# Patient Record
Sex: Female | Born: 1965 | Race: Black or African American | Hispanic: No | Marital: Married | Smoking: Never smoker
Health system: Southern US, Community
[De-identification: ages and names within clinical notes are randomized; demographics above are authoritative.]

## PROBLEM LIST (undated history)

## (undated) DIAGNOSIS — I10 Essential (primary) hypertension: Secondary | ICD-10-CM

---

## 2010-09-28 ENCOUNTER — Emergency Department (HOSPITAL_COMMUNITY)
Admission: EM | Admit: 2010-09-28 | Discharge: 2010-09-29 | Payer: Self-pay | Source: Home / Self Care | Admitting: Emergency Medicine

## 2010-09-29 ENCOUNTER — Encounter (INDEPENDENT_AMBULATORY_CARE_PROVIDER_SITE_OTHER): Payer: Self-pay | Admitting: General Surgery

## 2010-09-29 ENCOUNTER — Ambulatory Visit (HOSPITAL_COMMUNITY)
Admission: EM | Admit: 2010-09-29 | Discharge: 2010-09-30 | Payer: Self-pay | Source: Home / Self Care | Admitting: General Surgery

## 2011-01-31 ENCOUNTER — Other Ambulatory Visit (HOSPITAL_COMMUNITY)
Admission: RE | Admit: 2011-01-31 | Discharge: 2011-01-31 | Disposition: A | Discharge status: Home / Self Care | Payer: 59 | Source: Ambulatory Visit | Attending: Obstetrics and Gynecology | Admitting: Obstetrics and Gynecology

## 2011-01-31 DIAGNOSIS — Z01419 Encounter for gynecological examination (general) (routine) without abnormal findings: Secondary | ICD-10-CM | POA: Insufficient documentation

## 2011-02-01 ENCOUNTER — Other Ambulatory Visit: Payer: Self-pay | Admitting: Obstetrics and Gynecology

## 2011-02-15 ENCOUNTER — Other Ambulatory Visit: Payer: Self-pay | Admitting: Obstetrics and Gynecology

## 2011-02-15 DIAGNOSIS — Z1231 Encounter for screening mammogram for malignant neoplasm of breast: Secondary | ICD-10-CM

## 2011-02-23 LAB — URINALYSIS, ROUTINE W REFLEX MICROSCOPIC
Bilirubin Urine: NEGATIVE
Ketones, ur: 15 mg/dL — AB
Nitrite: NEGATIVE
Protein, ur: 30 mg/dL — AB
Urobilinogen, UA: 0.2 mg/dL (ref 0.0–1.0)
pH: 5.5 (ref 5.0–8.0)

## 2011-02-23 LAB — DIFFERENTIAL
Basophils Absolute: 0 10*3/uL (ref 0.0–0.1)
Basophils Absolute: 0.1 10*3/uL (ref 0.0–0.1)
Basophils Relative: 0 % (ref 0–1)
Basophils Relative: 0 % (ref 0–1)
Eosinophils Absolute: 0.1 10*3/uL (ref 0.0–0.7)
Eosinophils Relative: 1 % (ref 0–5)
Eosinophils Relative: 1 % (ref 0–5)
Monocytes Absolute: 1 10*3/uL (ref 0.1–1.0)
Monocytes Absolute: 1.3 10*3/uL — ABNORMAL HIGH (ref 0.1–1.0)

## 2011-02-23 LAB — COMPREHENSIVE METABOLIC PANEL
AST: 56 U/L — ABNORMAL HIGH (ref 0–37)
Albumin: 2.5 g/dL — ABNORMAL LOW (ref 3.5–5.2)
Alkaline Phosphatase: 66 U/L (ref 39–117)
Chloride: 103 mEq/L (ref 96–112)
Creatinine, Ser: 1.22 mg/dL — ABNORMAL HIGH (ref 0.4–1.2)
GFR calc Af Amer: 58 mL/min — ABNORMAL LOW (ref >60)
Potassium: 3.6 mEq/L (ref 3.5–5.1)
Total Bilirubin: 1 mg/dL (ref 0.3–1.2)

## 2011-02-23 LAB — HEPATIC FUNCTION PANEL
Bilirubin, Direct: 0.3 mg/dL (ref 0.0–0.3)
Indirect Bilirubin: 1.2 mg/dL — ABNORMAL HIGH (ref 0.3–0.9)

## 2011-02-23 LAB — POCT I-STAT, CHEM 8
Chloride: 99 mEq/L (ref 96–112)
Creatinine, Ser: 1.5 mg/dL — ABNORMAL HIGH (ref 0.4–1.2)
Hemoglobin: 13.9 g/dL (ref 12.0–15.0)
Potassium: 3.4 mEq/L — ABNORMAL LOW (ref 3.5–5.1)
Sodium: 132 mEq/L — ABNORMAL LOW (ref 135–145)

## 2011-02-23 LAB — CBC
HCT: 35.6 % — ABNORMAL LOW (ref 36.0–46.0)
HCT: 37.9 % (ref 36.0–46.0)
MCH: 28.4 pg (ref 26.0–34.0)
MCHC: 33.7 g/dL (ref 30.0–36.0)
MCHC: 33.8 g/dL (ref 30.0–36.0)
MCV: 84.2 fL (ref 78.0–100.0)
MCV: 86.2 fL (ref 78.0–100.0)
Platelets: 257 10*3/uL (ref 150–400)
Platelets: 363 10*3/uL (ref 150–400)
RBC: 3.37 MIL/uL — ABNORMAL LOW (ref 3.87–5.11)
RDW: 13.4 % (ref 11.5–15.5)
RDW: 13.5 % (ref 11.5–15.5)
WBC: 7.3 10*3/uL (ref 4.0–10.5)

## 2011-02-23 LAB — LIPASE, BLOOD: Lipase: 26 U/L (ref 11–59)

## 2011-02-23 LAB — WET PREP, GENITAL
Trich, Wet Prep: NONE SEEN
Yeast Wet Prep HPF POC: NONE SEEN

## 2011-02-23 LAB — GC/CHLAMYDIA PROBE AMP, GENITAL: Chlamydia, DNA Probe: NEGATIVE

## 2011-02-23 LAB — URINE MICROSCOPIC-ADD ON

## 2011-06-06 ENCOUNTER — Ambulatory Visit: Payer: Self-pay

## 2011-08-09 ENCOUNTER — Other Ambulatory Visit: Payer: Self-pay | Admitting: Obstetrics and Gynecology

## 2011-08-09 DIAGNOSIS — Z1231 Encounter for screening mammogram for malignant neoplasm of breast: Secondary | ICD-10-CM

## 2011-08-29 ENCOUNTER — Ambulatory Visit
Admission: RE | Admit: 2011-08-29 | Discharge: 2011-08-29 | Disposition: A | Discharge status: Home / Self Care | Payer: 59 | Source: Ambulatory Visit | Attending: Obstetrics and Gynecology | Admitting: Obstetrics and Gynecology

## 2011-08-29 DIAGNOSIS — Z1231 Encounter for screening mammogram for malignant neoplasm of breast: Secondary | ICD-10-CM

## 2012-04-19 ENCOUNTER — Other Ambulatory Visit: Payer: Self-pay | Admitting: Obstetrics and Gynecology

## 2012-04-19 DIAGNOSIS — Z1231 Encounter for screening mammogram for malignant neoplasm of breast: Secondary | ICD-10-CM

## 2012-04-19 DIAGNOSIS — Z803 Family history of malignant neoplasm of breast: Secondary | ICD-10-CM

## 2012-08-27 ENCOUNTER — Other Ambulatory Visit: Payer: Self-pay | Admitting: Obstetrics and Gynecology

## 2012-08-27 ENCOUNTER — Other Ambulatory Visit (HOSPITAL_COMMUNITY)
Admission: RE | Admit: 2012-08-27 | Discharge: 2012-08-27 | Disposition: A | Discharge status: Home / Self Care | Payer: 59 | Source: Ambulatory Visit | Attending: Obstetrics and Gynecology | Admitting: Obstetrics and Gynecology

## 2012-08-27 DIAGNOSIS — N760 Acute vaginitis: Secondary | ICD-10-CM | POA: Insufficient documentation

## 2012-08-27 DIAGNOSIS — Z01419 Encounter for gynecological examination (general) (routine) without abnormal findings: Secondary | ICD-10-CM | POA: Insufficient documentation

## 2012-08-29 ENCOUNTER — Ambulatory Visit
Admission: RE | Admit: 2012-08-29 | Discharge: 2012-08-29 | Disposition: A | Discharge status: Home / Self Care | Payer: 59 | Source: Ambulatory Visit | Attending: Obstetrics and Gynecology | Admitting: Obstetrics and Gynecology

## 2012-08-29 DIAGNOSIS — Z803 Family history of malignant neoplasm of breast: Secondary | ICD-10-CM

## 2012-08-29 DIAGNOSIS — Z1231 Encounter for screening mammogram for malignant neoplasm of breast: Secondary | ICD-10-CM

## 2012-08-31 ENCOUNTER — Other Ambulatory Visit: Payer: Self-pay | Admitting: Obstetrics and Gynecology

## 2012-08-31 DIAGNOSIS — R928 Other abnormal and inconclusive findings on diagnostic imaging of breast: Secondary | ICD-10-CM

## 2012-09-07 ENCOUNTER — Ambulatory Visit
Admission: RE | Admit: 2012-09-07 | Discharge: 2012-09-07 | Disposition: A | Discharge status: Home / Self Care | Payer: 59 | Source: Ambulatory Visit | Attending: Obstetrics and Gynecology | Admitting: Obstetrics and Gynecology

## 2012-09-07 DIAGNOSIS — R928 Other abnormal and inconclusive findings on diagnostic imaging of breast: Secondary | ICD-10-CM

## 2013-09-23 ENCOUNTER — Other Ambulatory Visit: Payer: Self-pay

## 2013-09-23 DIAGNOSIS — Z1231 Encounter for screening mammogram for malignant neoplasm of breast: Secondary | ICD-10-CM

## 2013-10-16 ENCOUNTER — Ambulatory Visit
Admission: RE | Admit: 2013-10-16 | Discharge: 2013-10-16 | Disposition: A | Discharge status: Home / Self Care | Payer: 59 | Source: Ambulatory Visit

## 2013-10-16 DIAGNOSIS — Z1231 Encounter for screening mammogram for malignant neoplasm of breast: Secondary | ICD-10-CM

## 2014-09-10 ENCOUNTER — Other Ambulatory Visit: Payer: Self-pay

## 2014-09-10 DIAGNOSIS — Z1231 Encounter for screening mammogram for malignant neoplasm of breast: Secondary | ICD-10-CM

## 2014-10-09 ENCOUNTER — Other Ambulatory Visit: Payer: Self-pay | Admitting: Obstetrics and Gynecology

## 2014-10-09 ENCOUNTER — Other Ambulatory Visit (HOSPITAL_COMMUNITY)
Admission: RE | Admit: 2014-10-09 | Discharge: 2014-10-09 | Disposition: A | Discharge status: Home / Self Care | Payer: 59 | Source: Ambulatory Visit | Attending: Obstetrics and Gynecology | Admitting: Obstetrics and Gynecology

## 2014-10-09 DIAGNOSIS — Z01419 Encounter for gynecological examination (general) (routine) without abnormal findings: Secondary | ICD-10-CM | POA: Insufficient documentation

## 2014-10-09 DIAGNOSIS — Z1151 Encounter for screening for human papillomavirus (HPV): Secondary | ICD-10-CM | POA: Diagnosis present

## 2014-10-10 LAB — CYTOLOGY - PAP

## 2014-10-17 ENCOUNTER — Ambulatory Visit: Payer: 59

## 2014-10-31 ENCOUNTER — Emergency Department (INDEPENDENT_AMBULATORY_CARE_PROVIDER_SITE_OTHER)
Admission: EM | Admit: 2014-10-31 | Discharge: 2014-10-31 | Disposition: A | Discharge status: Home / Self Care | Payer: 59 | Source: Home / Self Care | Attending: Family Medicine | Admitting: Family Medicine

## 2014-10-31 ENCOUNTER — Encounter (HOSPITAL_COMMUNITY): Payer: Self-pay | Admitting: Emergency Medicine

## 2014-10-31 DIAGNOSIS — S134XXA Sprain of ligaments of cervical spine, initial encounter: Secondary | ICD-10-CM

## 2014-10-31 HISTORY — DX: Essential (primary) hypertension: I10

## 2014-10-31 MED ORDER — CYCLOBENZAPRINE HCL 5 MG PO TABS: 5.0000 mg | ORAL_TABLET | Freq: Every evening | ORAL | Status: DC | PRN

## 2014-10-31 MED ORDER — TRAMADOL HCL 50 MG PO TABS: 50.0000 mg | ORAL_TABLET | Freq: Four times a day (QID) | ORAL | Status: DC | PRN

## 2014-10-31 NOTE — Discharge Instructions (Signed)
Thank you for coming in today. °Come back or go to the emergency room if you notice new weakness new numbness problems walking or bowel or bladder problems. ° ° °Cervical Strain and Sprain (Whiplash) °with Rehab °Cervical strain and sprain are injuries that commonly occur with "whiplash" injuries. Whiplash occurs when the neck is forcefully whipped backward or forward, such as during a motor vehicle accident or during contact sports. The muscles, ligaments, tendons, discs, and nerves of the neck are susceptible to injury when this occurs. °RISK FACTORS °Risk of having a whiplash injury increases if: °· Osteoarthritis of the spine. °· Situations that make head or neck accidents or trauma more likely. °· High-risk sports (football, rugby, wrestling, hockey, auto racing, gymnastics, diving, contact karate, or boxing). °· Poor strength and flexibility of the neck. °· Previous neck injury. °· Poor tackling technique. °· Improperly fitted or padded equipment. °SYMPTOMS  °· Pain or stiffness in the front or back of neck or both. °· Symptoms may present immediately or up to 24 hours after injury. °· Dizziness, headache, nausea, and vomiting. °· Muscle spasm with soreness and stiffness in the neck. °· Tenderness and swelling at the injury site. °PREVENTION °· Learn and use proper technique (avoid tackling with the head, spearing, and head-butting; use proper falling techniques to avoid landing on the head). °· Warm up and stretch properly before activity. °· Maintain physical fitness: °¨ Strength, flexibility, and endurance. °¨ Cardiovascular fitness. °· Wear properly fitted and padded protective equipment, such as padded soft collars, for participation in contact sports. °PROGNOSIS  °Recovery from cervical strain and sprain injuries is dependent on the extent of the injury. These injuries are usually curable in 1 week to 3 months with appropriate treatment.  °RELATED COMPLICATIONS  °· Temporary numbness and weakness may  occur if the nerve roots are damaged, and this may persist until the nerve has completely healed. °· Chronic pain due to frequent recurrence of symptoms. °· Prolonged healing, especially if activity is resumed too soon (before complete recovery). °TREATMENT  °Treatment initially involves the use of ice and medication to help reduce pain and inflammation. It is also important to perform strengthening and stretching exercises and modify activities that worsen symptoms so the injury does not get worse. These exercises may be performed at home or with a therapist. For patients who experience severe symptoms, a soft, padded collar may be recommended to be worn around the neck.  °Improving your posture may help reduce symptoms. Posture improvement includes pulling your chin and abdomen in while sitting or standing. If you are sitting, sit in a firm chair with your buttocks against the back of the chair. While sleeping, try replacing your pillow with a small towel rolled to 2 inches in diameter, or use a cervical pillow or soft cervical collar. Poor sleeping positions delay healing.  °For patients with nerve root damage, which causes numbness or weakness, the use of a cervical traction apparatus may be recommended. Surgery is rarely necessary for these injuries. However, cervical strain and sprains that are present at birth (congenital) may require surgery. °MEDICATION  °· If pain medication is necessary, nonsteroidal anti-inflammatory medications, such as aspirin and ibuprofen, or other minor pain relievers, such as acetaminophen, are often recommended. °· Do not take pain medication for 7 days before surgery. °· Prescription pain relievers may be given if deemed necessary by your caregiver. Use only as directed and only as much as you need. °HEAT AND COLD:  °· Cold treatment (icing) relieves   pain and reduces inflammation. Cold treatment should be applied for 10 to 15 minutes every 2 to 3 hours for inflammation and pain  and immediately after any activity that aggravates your symptoms. Use ice packs or an ice massage. °· Heat treatment may be used prior to performing the stretching and strengthening activities prescribed by your caregiver, physical therapist, or athletic trainer. Use a heat pack or a warm soak. °SEEK MEDICAL CARE IF:  °· Symptoms get worse or do not improve in 2 weeks despite treatment. °· New, unexplained symptoms develop (drugs used in treatment may produce side effects). °EXERCISES °RANGE OF MOTION (ROM) AND STRETCHING EXERCISES - Cervical Strain and Sprain °These exercises may help you when beginning to rehabilitate your injury. In order to successfully resolve your symptoms, you must improve your posture. These exercises are designed to help reduce the forward-head and rounded-shoulder posture which contributes to this condition. Your symptoms may resolve with or without further involvement from your physician, physical therapist or athletic trainer. While completing these exercises, remember:  °· Restoring tissue flexibility helps normal motion to return to the joints. This allows healthier, less painful movement and activity. °· An effective stretch should be held for at least 20 seconds, although you may need to begin with shorter hold times for comfort. °· A stretch should never be painful. You should only feel a gentle lengthening or release in the stretched tissue. °STRETCH- Axial Extensors °· Lie on your back on the floor. You may bend your knees for comfort. Place a rolled-up hand towel or dish towel, about 2 inches in diameter, under the part of your head that makes contact with the floor. °· Gently tuck your chin, as if trying to make a "double chin," until you feel a gentle stretch at the base of your head. °· Hold __________ seconds. °Repeat __________ times. Complete this exercise __________ times per day.  °STRETCH - Axial Extension  °· Stand or sit on a firm surface. Assume a good posture: chest  up, shoulders drawn back, abdominal muscles slightly tense, knees unlocked (if standing) and feet hip width apart. °· Slowly retract your chin so your head slides back and your chin slightly lowers. Continue to look straight ahead. °· You should feel a gentle stretch in the back of your head. Be certain not to feel an aggressive stretch since this can cause headaches later. °· Hold for __________ seconds. °Repeat __________ times. Complete this exercise __________ times per day. °STRETCH - Cervical Side Bend  °· Stand or sit on a firm surface. Assume a good posture: chest up, shoulders drawn back, abdominal muscles slightly tense, knees unlocked (if standing) and feet hip width apart. °· Without letting your nose or shoulders move, slowly tip your right / left ear to your shoulder until your feel a gentle stretch in the muscles on the opposite side of your neck. °· Hold __________ seconds. °Repeat __________ times. Complete this exercise __________ times per day. °STRETCH - Cervical Rotators  °· Stand or sit on a firm surface. Assume a good posture: chest up, shoulders drawn back, abdominal muscles slightly tense, knees unlocked (if standing) and feet hip width apart. °· Keeping your eyes level with the ground, slowly turn your head until you feel a gentle stretch along the back and opposite side of your neck. °· Hold __________ seconds. °Repeat __________ times. Complete this exercise __________ times per day. °RANGE OF MOTION - Neck Circles  °· Stand or sit on a firm surface. Assume a   good posture: chest up, shoulders drawn back, abdominal muscles slightly tense, knees unlocked (if standing) and feet hip width apart. °· Gently roll your head down and around from the back of one shoulder to the back of the other. The motion should never be forced or painful. °· Repeat the motion 10-20 times, or until you feel the neck muscles relax and loosen. °Repeat __________ times. Complete the exercise __________ times per  day. °STRENGTHENING EXERCISES - Cervical Strain and Sprain °These exercises may help you when beginning to rehabilitate your injury. They may resolve your symptoms with or without further involvement from your physician, physical therapist, or athletic trainer. While completing these exercises, remember:  °· Muscles can gain both the endurance and the strength needed for everyday activities through controlled exercises. °· Complete these exercises as instructed by your physician, physical therapist, or athletic trainer. Progress the resistance and repetitions only as guided. °· You may experience muscle soreness or fatigue, but the pain or discomfort you are trying to eliminate should never worsen during these exercises. If this pain does worsen, stop and make certain you are following the directions exactly. If the pain is still present after adjustments, discontinue the exercise until you can discuss the trouble with your clinician. °STRENGTH - Cervical Flexors, Isometric °· Face a wall, standing about 6 inches away. Place a small pillow, a ball about 6-8 inches in diameter, or a folded towel between your forehead and the wall. °· Slightly tuck your chin and gently push your forehead into the soft object. Push only with mild to moderate intensity, building up tension gradually. Keep your jaw and forehead relaxed. °· Hold 10 to 20 seconds. Keep your breathing relaxed. °· Release the tension slowly. Relax your neck muscles completely before you start the next repetition. °Repeat __________ times. Complete this exercise __________ times per day. °STRENGTH- Cervical Lateral Flexors, Isometric  °· Stand about 6 inches away from a wall. Place a small pillow, a ball about 6-8 inches in diameter, or a folded towel between the side of your head and the wall. °· Slightly tuck your chin and gently tilt your head into the soft object. Push only with mild to moderate intensity, building up tension gradually. Keep your jaw and  forehead relaxed. °· Hold 10 to 20 seconds. Keep your breathing relaxed. °· Release the tension slowly. Relax your neck muscles completely before you start the next repetition. °Repeat __________ times. Complete this exercise __________ times per day. °STRENGTH - Cervical Extensors, Isometric  °· Stand about 6 inches away from a wall. Place a small pillow, a ball about 6-8 inches in diameter, or a folded towel between the back of your head and the wall. °· Slightly tuck your chin and gently tilt your head back into the soft object. Push only with mild to moderate intensity, building up tension gradually. Keep your jaw and forehead relaxed. °· Hold 10 to 20 seconds. Keep your breathing relaxed. °· Release the tension slowly. Relax your neck muscles completely before you start the next repetition. °Repeat __________ times. Complete this exercise __________ times per day. °POSTURE AND BODY MECHANICS CONSIDERATIONS - Cervical Strain and Sprain °Keeping correct posture when sitting, standing or completing your activities will reduce the stress put on different body tissues, allowing injured tissues a chance to heal and limiting painful experiences. The following are general guidelines for improved posture. Your physician or physical therapist will provide you with any instructions specific to your needs. While reading these guidelines, remember: °·   The exercises prescribed by your provider will help you have the flexibility and strength to maintain correct postures. °· The correct posture provides the optimal environment for your joints to work. All of your joints have less wear and tear when properly supported by a spine with good posture. This means you will experience a healthier, less painful body. °· Correct posture must be practiced with all of your activities, especially prolonged sitting and standing. Correct posture is as important when doing repetitive low-stress activities (typing) as it is when doing a single  heavy-load activity (lifting). °PROLONGED STANDING WHILE SLIGHTLY LEANING FORWARD °When completing a task that requires you to lean forward while standing in one place for a long time, place either foot up on a stationary 2- to 4-inch high object to help maintain the best posture. When both feet are on the ground, the low back tends to lose its slight inward curve. If this curve flattens (or becomes too large), then the back and your other joints will experience too much stress, fatigue more quickly, and can cause pain.  °RESTING POSITIONS °Consider which positions are most painful for you when choosing a resting position. If you have pain with flexion-based activities (sitting, bending, stooping, squatting), choose a position that allows you to rest in a less flexed posture. You would want to avoid curling into a fetal position on your side. If your pain worsens with extension-based activities (prolonged standing, working overhead), avoid resting in an extended position such as sleeping on your stomach. Most people will find more comfort when they rest with their spine in a more neutral position, neither too rounded nor too arched. Lying on a non-sagging bed on your side with a pillow between your knees, or on your back with a pillow under your knees will often provide some relief. Keep in mind, being in any one position for a prolonged period of time, no matter how correct your posture, can still lead to stiffness. °WALKING °Walk with an upright posture. Your ears, shoulders, and hips should all line up. °OFFICE WORK °When working at a desk, create an environment that supports good, upright posture. Without extra support, muscles fatigue and lead to excessive strain on joints and other tissues. °CHAIR: °· A chair should be able to slide under your desk when your back makes contact with the back of the chair. This allows you to work closely. °· The chair's height should allow your eyes to be level with the upper  part of your monitor and your hands to be slightly lower than your elbows. °· Body position: °¨ Your feet should make contact with the floor. If this is not possible, use a foot rest. °¨ Keep your ears over your shoulders. This will reduce stress on your neck and low back. °Document Released: 11/28/2005 Document Revised: 04/14/2014 Document Reviewed: 03/12/2009 °ExitCare® Patient Information ©2015 [employer]. This information is not intended to replace advice given to you by your health care provider. Make sure you discuss any questions you have with your health care provider. ° °
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
## 2014-11-11 ENCOUNTER — Ambulatory Visit
Admission: RE | Admit: 2014-11-11 | Discharge: 2014-11-11 | Disposition: A | Discharge status: Home / Self Care | Payer: 59 | Source: Ambulatory Visit

## 2014-11-11 DIAGNOSIS — Z1231 Encounter for screening mammogram for malignant neoplasm of breast: Secondary | ICD-10-CM

## 2014-11-12 ENCOUNTER — Other Ambulatory Visit: Payer: Self-pay | Admitting: Internal Medicine

## 2014-11-12 DIAGNOSIS — R928 Other abnormal and inconclusive findings on diagnostic imaging of breast: Secondary | ICD-10-CM

## 2014-11-20 ENCOUNTER — Ambulatory Visit: Payer: 59 | Attending: Family Medicine | Admitting: Physical Therapy

## 2014-11-20 ENCOUNTER — Telehealth: Payer: Self-pay | Admitting: *Deleted

## 2014-11-20 DIAGNOSIS — S161XXD Strain of muscle, fascia and tendon at neck level, subsequent encounter: Secondary | ICD-10-CM | POA: Diagnosis present

## 2014-11-20 DIAGNOSIS — S161XXA Strain of muscle, fascia and tendon at neck level, initial encounter: Secondary | ICD-10-CM

## 2014-11-20 NOTE — Patient Instructions (Signed)
AROM: Neck Rotation   Turn head slowly to look over one shoulder, then the other. Hold each position ___2_ seconds. Repeat ___5_ times per set. Do _1___ sets per session. Do ___5_ sessions per day.  http://orth.exer.us/294   Copyright  VHI. All rights reserved.  AROM: Lateral Neck Flexion   Slowly tilt head toward one shoulder, then the other. Hold each position ____2 seconds. Repeat ___5_ times per set. Do ___1_ sets per session. Do __5__ sessions per day.  http://orth.exer.us/296   Copyright  VHI. All rights reserved.  Extension   Hands behind neck, bend head back as far as is comfortable. Hold __2__ seconds. Repeat _5___ times. Do __5__ sessions per day.  Extensors, Supine   Lie supine, head on small, rolled towel. Gently tuck chin and bring toward chest. Hold _2__ seconds. Repeat _5__ times per session. Do __5_ sessions per day.  Copyright  VHI. All rights reserved.  Flexibility: Neck Retraction   Pull head straight back, keeping eyes and jaw level. Repeat ___2_ times per set. Do __2 sets per session. Do 5____ sessions per day.  http://orth.exer.us/344   Copyright  VHI. All rights reserved.

## 2014-11-20 NOTE — Telephone Encounter (Signed)
appts made printed. Pt only wanted to schedule the 2 days that's she's scheduled for...td
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
## 2014-11-27 ENCOUNTER — Ambulatory Visit: Payer: 59 | Admitting: Physical Therapy

## 2014-12-01 ENCOUNTER — Inpatient Hospital Stay
Admission: RE | Admit: 2014-12-01 | Discharge: 2014-12-01 | Disposition: A | Discharge status: Home / Self Care | Payer: 59 | Source: Ambulatory Visit | Attending: Internal Medicine | Admitting: Internal Medicine

## 2014-12-02 ENCOUNTER — Encounter: Payer: 59 | Admitting: Rehabilitation

## 2015-01-26 ENCOUNTER — Other Ambulatory Visit: Payer: 59

## 2015-02-03 ENCOUNTER — Ambulatory Visit
Admission: RE | Admit: 2015-02-03 | Discharge: 2015-02-03 | Disposition: A | Discharge status: Home / Self Care | Payer: 59 | Source: Ambulatory Visit | Attending: Internal Medicine | Admitting: Internal Medicine

## 2015-02-03 ENCOUNTER — Other Ambulatory Visit: Payer: Self-pay | Admitting: Internal Medicine

## 2015-02-03 DIAGNOSIS — R928 Other abnormal and inconclusive findings on diagnostic imaging of breast: Secondary | ICD-10-CM

## 2015-10-29 ENCOUNTER — Other Ambulatory Visit: Payer: Self-pay | Admitting: Obstetrics and Gynecology

## 2015-10-29 ENCOUNTER — Other Ambulatory Visit (HOSPITAL_COMMUNITY)
Admission: RE | Admit: 2015-10-29 | Discharge: 2015-10-29 | Disposition: A | Discharge status: Home / Self Care | Payer: 59 | Source: Ambulatory Visit | Attending: Obstetrics and Gynecology | Admitting: Obstetrics and Gynecology

## 2015-10-29 DIAGNOSIS — Z01419 Encounter for gynecological examination (general) (routine) without abnormal findings: Secondary | ICD-10-CM | POA: Insufficient documentation

## 2015-11-03 LAB — CYTOLOGY - PAP

## 2015-12-16 ENCOUNTER — Other Ambulatory Visit: Payer: Self-pay

## 2015-12-16 DIAGNOSIS — Z1231 Encounter for screening mammogram for malignant neoplasm of breast: Secondary | ICD-10-CM

## 2016-01-01 ENCOUNTER — Ambulatory Visit
Admission: RE | Admit: 2016-01-01 | Discharge: 2016-01-01 | Disposition: A | Discharge status: Home / Self Care | Payer: 59 | Source: Ambulatory Visit

## 2016-01-01 DIAGNOSIS — Z1231 Encounter for screening mammogram for malignant neoplasm of breast: Secondary | ICD-10-CM

## 2016-12-07 ENCOUNTER — Other Ambulatory Visit: Payer: Self-pay | Admitting: Internal Medicine

## 2016-12-07 DIAGNOSIS — Z1231 Encounter for screening mammogram for malignant neoplasm of breast: Secondary | ICD-10-CM

## 2017-01-02 ENCOUNTER — Other Ambulatory Visit: Payer: Self-pay | Admitting: Obstetrics and Gynecology

## 2017-01-02 ENCOUNTER — Other Ambulatory Visit (HOSPITAL_COMMUNITY)
Admission: RE | Admit: 2017-01-02 | Discharge: 2017-01-02 | Disposition: A | Discharge status: Home / Self Care | Payer: 59 | Source: Ambulatory Visit | Attending: Obstetrics and Gynecology | Admitting: Obstetrics and Gynecology

## 2017-01-02 DIAGNOSIS — Z01419 Encounter for gynecological examination (general) (routine) without abnormal findings: Secondary | ICD-10-CM | POA: Insufficient documentation

## 2017-01-03 LAB — CYTOLOGY - PAP: DIAGNOSIS: NEGATIVE

## 2017-01-09 ENCOUNTER — Ambulatory Visit
Admission: RE | Admit: 2017-01-09 | Discharge: 2017-01-09 | Disposition: A | Discharge status: Home / Self Care | Payer: 59 | Source: Ambulatory Visit | Attending: Internal Medicine | Admitting: Internal Medicine

## 2017-01-09 DIAGNOSIS — Z1231 Encounter for screening mammogram for malignant neoplasm of breast: Secondary | ICD-10-CM

## 2017-12-06 ENCOUNTER — Other Ambulatory Visit: Payer: Self-pay | Admitting: Internal Medicine

## 2017-12-06 DIAGNOSIS — Z1231 Encounter for screening mammogram for malignant neoplasm of breast: Secondary | ICD-10-CM

## 2018-01-10 ENCOUNTER — Ambulatory Visit
Admission: RE | Admit: 2018-01-10 | Discharge: 2018-01-10 | Disposition: A | Discharge status: Home / Self Care | Payer: Managed Care, Other (non HMO) | Source: Ambulatory Visit | Attending: Internal Medicine | Admitting: Internal Medicine

## 2018-01-10 DIAGNOSIS — Z1231 Encounter for screening mammogram for malignant neoplasm of breast: Secondary | ICD-10-CM

## 2018-12-13 ENCOUNTER — Other Ambulatory Visit: Payer: Self-pay | Admitting: Internal Medicine

## 2018-12-13 DIAGNOSIS — Z1231 Encounter for screening mammogram for malignant neoplasm of breast: Secondary | ICD-10-CM

## 2018-12-28 LAB — HM COLONOSCOPY

## 2019-01-14 ENCOUNTER — Ambulatory Visit: Payer: Managed Care, Other (non HMO)

## 2019-01-31 ENCOUNTER — Ambulatory Visit: Payer: Self-pay | Admitting: Internal Medicine

## 2019-02-08 ENCOUNTER — Ambulatory Visit
Admission: RE | Admit: 2019-02-08 | Discharge: 2019-02-08 | Disposition: A | Discharge status: Home / Self Care | Payer: Managed Care, Other (non HMO) | Source: Ambulatory Visit | Attending: Internal Medicine | Admitting: Internal Medicine

## 2019-02-08 DIAGNOSIS — Z1231 Encounter for screening mammogram for malignant neoplasm of breast: Secondary | ICD-10-CM

## 2019-02-28 ENCOUNTER — Other Ambulatory Visit: Payer: Self-pay

## 2019-02-28 ENCOUNTER — Encounter: Payer: Self-pay | Admitting: Internal Medicine

## 2019-02-28 ENCOUNTER — Encounter: Payer: Managed Care, Other (non HMO) | Admitting: Internal Medicine

## 2019-02-28 MED ORDER — OLMESARTAN MEDOXOMIL-HCTZ 40-25 MG PO TABS: 1.0000 | ORAL_TABLET | Freq: Every day | ORAL | 0 refills | Status: DC

## 2019-03-10 NOTE — Progress Notes (Signed)
She left prior to being seen. She will be rescheduled for an e-visit.

## 2019-03-15 ENCOUNTER — Other Ambulatory Visit: Payer: Self-pay

## 2019-03-15 MED ORDER — OLMESARTAN MEDOXOMIL-HCTZ 40-25 MG PO TABS: 1.0000 | ORAL_TABLET | Freq: Every day | ORAL | 0 refills | Status: DC

## 2019-03-23 ENCOUNTER — Encounter: Payer: Self-pay | Admitting: Internal Medicine

## 2019-07-01 ENCOUNTER — Other Ambulatory Visit: Payer: Self-pay | Admitting: Obstetrics and Gynecology

## 2019-07-01 ENCOUNTER — Other Ambulatory Visit (HOSPITAL_COMMUNITY)
Admission: RE | Admit: 2019-07-01 | Discharge: 2019-07-01 | Disposition: A | Discharge status: Home / Self Care | Payer: Managed Care, Other (non HMO) | Source: Ambulatory Visit | Attending: Obstetrics and Gynecology | Admitting: Obstetrics and Gynecology

## 2019-07-01 DIAGNOSIS — Z01419 Encounter for gynecological examination (general) (routine) without abnormal findings: Secondary | ICD-10-CM | POA: Diagnosis present

## 2019-07-03 LAB — CYTOLOGY - PAP
Diagnosis: NEGATIVE
HPV: NOT DETECTED

## 2019-08-12 ENCOUNTER — Other Ambulatory Visit: Payer: Self-pay

## 2019-08-12 ENCOUNTER — Encounter: Payer: Self-pay | Admitting: Internal Medicine

## 2019-08-12 ENCOUNTER — Ambulatory Visit (INDEPENDENT_AMBULATORY_CARE_PROVIDER_SITE_OTHER): Payer: Managed Care, Other (non HMO) | Admitting: Internal Medicine

## 2019-08-12 VITALS — BP 138/80 | HR 73 | Temp 98.1°F | Ht 63.6 in | Wt 224.8 lb

## 2019-08-12 DIAGNOSIS — Z6839 Body mass index (BMI) 39.0-39.9, adult: Secondary | ICD-10-CM

## 2019-08-12 DIAGNOSIS — M25512 Pain in left shoulder: Secondary | ICD-10-CM | POA: Diagnosis not present

## 2019-08-12 DIAGNOSIS — Z0000 Encounter for general adult medical examination without abnormal findings: Secondary | ICD-10-CM | POA: Diagnosis not present

## 2019-08-12 DIAGNOSIS — I10 Essential (primary) hypertension: Secondary | ICD-10-CM | POA: Diagnosis not present

## 2019-08-12 DIAGNOSIS — E66812 Obesity, class 2: Secondary | ICD-10-CM

## 2019-08-12 LAB — POCT URINALYSIS DIPSTICK
Bilirubin, UA: NEGATIVE
Blood, UA: NEGATIVE
Glucose, UA: NEGATIVE
Ketones, UA: NEGATIVE
Leukocytes, UA: NEGATIVE
Nitrite, UA: NEGATIVE
Protein, UA: NEGATIVE
Spec Grav, UA: <=1.005 — AB (ref 1.010–1.025)
Urobilinogen, UA: 0.2 E.U./dL
pH, UA: 6 (ref 5.0–8.0)

## 2019-08-12 LAB — POCT UA - MICROALBUMIN
Albumin/Creatinine Ratio, Urine, POC: <30
Creatinine, POC: 10 mg/dL
Microalbumin Ur, POC: 10 mg/L

## 2019-08-12 LAB — CBC
Hematocrit: 33.6 % — ABNORMAL LOW (ref 34.0–46.6)
Hemoglobin: 10.8 g/dL — ABNORMAL LOW (ref 11.1–15.9)
MCH: 27.3 pg (ref 26.6–33.0)
MCHC: 32.1 g/dL (ref 31.5–35.7)
MCV: 85 fL (ref 79–97)
Platelets: 353 10*3/uL (ref 150–450)
RBC: 3.95 x10E6/uL (ref 3.77–5.28)
RDW: 16.3 % — ABNORMAL HIGH (ref 11.7–15.4)
WBC: 4.8 10*3/uL (ref 3.4–10.8)

## 2019-08-12 LAB — LIPID PANEL
Chol/HDL Ratio: 2.7 ratio (ref 0.0–4.4)
Cholesterol, Total: 197 mg/dL (ref 100–199)
HDL: 73 mg/dL (ref >39)
LDL Chol Calc (NIH): 109 mg/dL — ABNORMAL HIGH (ref 0–99)
Triglycerides: 81 mg/dL (ref 0–149)
VLDL Cholesterol Cal: 15 mg/dL (ref 5–40)

## 2019-08-12 LAB — CMP14+EGFR
ALT: 9 IU/L (ref 0–32)
AST: 16 IU/L (ref 0–40)
Albumin/Globulin Ratio: 1.2 (ref 1.2–2.2)
Albumin: 4.1 g/dL (ref 3.8–4.9)
Alkaline Phosphatase: 60 IU/L (ref 39–117)
BUN/Creatinine Ratio: 9 (ref 9–23)
BUN: 9 mg/dL (ref 6–24)
Bilirubin Total: 0.5 mg/dL (ref 0.0–1.2)
CO2: 21 mmol/L (ref 20–29)
Calcium: 9.3 mg/dL (ref 8.7–10.2)
Chloride: 103 mmol/L (ref 96–106)
Creatinine, Ser: 1.04 mg/dL — ABNORMAL HIGH (ref 0.57–1.00)
GFR calc Af Amer: 72 mL/min/{1.73_m2} (ref >59)
GFR calc non Af Amer: 62 mL/min/{1.73_m2} (ref >59)
Globulin, Total: 3.3 g/dL (ref 1.5–4.5)
Glucose: 95 mg/dL (ref 65–99)
Potassium: 3.9 mmol/L (ref 3.5–5.2)
Sodium: 139 mmol/L (ref 134–144)
Total Protein: 7.4 g/dL (ref 6.0–8.5)

## 2019-08-12 LAB — HEMOGLOBIN A1C
Est. average glucose Bld gHb Est-mCnc: 120 mg/dL
Hgb A1c MFr Bld: 5.8 % — ABNORMAL HIGH (ref 4.8–5.6)

## 2019-08-12 MED ORDER — OLMESARTAN MEDOXOMIL-HCTZ 40-25 MG PO TABS: 1.0000 | ORAL_TABLET | Freq: Every day | ORAL | 2 refills | Status: DC

## 2019-08-12 MED ORDER — OLMESARTAN MEDOXOMIL-HCTZ 40-25 MG PO TABS: 1.0000 | ORAL_TABLET | Freq: Every day | ORAL | 1 refills | Status: DC

## 2019-08-12 NOTE — Addendum Note (Signed)
Addended by: Michelle Nasuti on: 08/12/2019 12:29 PM   Modules accepted: Orders

## 2019-08-12 NOTE — Patient Instructions (Addendum)
Voltaren cream for shoulder pain  Health Maintenance, Female Adopting a healthy lifestyle and getting preventive care are important in promoting health and wellness. Ask your health care provider about:  The right schedule for you to have regular tests and exams.  Things you can do on your own to prevent diseases and keep yourself healthy. What should I know about diet, weight, and exercise? Eat a healthy diet   Eat a diet that includes plenty of vegetables, fruits, low-fat dairy products, and lean protein.  Do not eat a lot of foods that are high in solid fats, added sugars, or sodium. Maintain a healthy weight Body mass index (BMI) is used to identify weight problems. It estimates body fat based on height and weight. Your health care provider can help determine your BMI and help you achieve or maintain a healthy weight. Get regular exercise Get regular exercise. This is one of the most important things you can do for your health. Most adults should:  Exercise for at least 150 minutes each week. The exercise should increase your heart rate and make you sweat (moderate-intensity exercise).  Do strengthening exercises at least twice a week. This is in addition to the moderate-intensity exercise.  Spend less time sitting. Even light physical activity can be beneficial. Watch cholesterol and blood lipids Have your blood tested for lipids and cholesterol at 51 years of age, then have this test every 5 years. Have your cholesterol levels checked more often if:  Your lipid or cholesterol levels are high.  You are older than 51 years of age.  You are at high risk for heart disease. What should I know about cancer screening? Depending on your health history and family history, you may need to have cancer screening at various ages. This may include screening for:  Breast cancer.  Cervical cancer.  Colorectal cancer.  Skin cancer.  Lung cancer. What should I know about heart  disease, diabetes, and high blood pressure? Blood pressure and heart disease  High blood pressure causes heart disease and increases the risk of stroke. This is more likely to develop in people who have high blood pressure readings, are of African descent, or are overweight.  Have your blood pressure checked: ? Every 3-5 years if you are 38-76 years of age. ? Every year if you are 66 years old or older. Diabetes Have regular diabetes screenings. This checks your fasting blood sugar level. Have the screening done:  Once every three years after age 29 if you are at a normal weight and have a low risk for diabetes.  More often and at a younger age if you are overweight or have a high risk for diabetes. What should I know about preventing infection? Hepatitis B If you have a higher risk for hepatitis B, you should be screened for this virus. Talk with your health care provider to find out if you are at risk for hepatitis B infection. Hepatitis C Testing is recommended for:  Everyone born from [birth year] through 1963.  Anyone with known risk factors for hepatitis C. Sexually transmitted infections (STIs)  Get screened for STIs, including gonorrhea and chlamydia, if: ? You are sexually active and are younger than 51 years of age. ? You are older than 51 years of age and your health care provider tells you that you are at risk for this type of infection. ? Your sexual activity has changed since you were last screened, and you are at increased risk for chlamydia or gonorrhea.  Ask your health care provider if you are at risk.  Ask your health care provider about whether you are at high risk for HIV. Your health care provider may recommend a prescription medicine to help prevent HIV infection. If you choose to take medicine to prevent HIV, you should first get tested for HIV. You should then be tested every 3 months for as long as you are taking the medicine. Pregnancy  If you are about to stop  having your period (premenopausal) and you may become pregnant, seek counseling before you get pregnant.  Take 400 to 800 micrograms (mcg) of folic acid every day if you become pregnant.  Ask for birth control (contraception) if you want to prevent pregnancy. Osteoporosis and menopause Osteoporosis is a disease in which the bones lose minerals and strength with aging. This can result in bone fractures. If you are 90 years old or older, or if you are at risk for osteoporosis and fractures, ask your health care provider if you should:  Be screened for bone loss.  Take a calcium or vitamin D supplement to lower your risk of fractures.  Be given hormone replacement therapy (HRT) to treat symptoms of menopause. Follow these instructions at home: Lifestyle  Do not use any products that contain nicotine or tobacco, such as cigarettes, e-cigarettes, and chewing tobacco. If you need help quitting, ask your health care provider.  Do not use street drugs.  Do not share needles.  Ask your health care provider for help if you need support or information about quitting drugs. Alcohol use  Do not drink alcohol if: ? Your health care provider tells you not to drink. ? You are pregnant, may be pregnant, or are planning to become pregnant.  If you drink alcohol: ? Limit how much you use to 0-1 drink a day. ? Limit intake if you are breastfeeding.  Be aware of how much alcohol is in your drink. In the [place] one drink equals one 12 oz bottle of beer (355 mL), one 5 oz glass of wine (148 mL), or one 1 oz glass of hard liquor (44 mL). General instructions  Schedule regular health, dental, and eye exams.  Stay current with your vaccines.  Tell your health care provider if: ? You often feel depressed. ? You have ever been abused or do not feel safe at home. Summary  Adopting a healthy lifestyle and getting preventive care are important in promoting health and wellness.  Follow your health  care provider's instructions about healthy diet, exercising, and getting tested or screened for diseases.  Follow your health care provider's instructions on monitoring your cholesterol and blood pressure. This information is not intended to replace advice given to you by your health care provider. Make sure you discuss any questions you have with your health care provider. Document Released: 06/13/2011 Document Revised: 11/21/2018 Document Reviewed: 11/21/2018 Elsevier Patient Education  2020 [employer].

## 2019-08-12 NOTE — Progress Notes (Signed)
Subjective:     Patient ID: Christina Houston , female    DOB: 19-Aug-1966 , 53 y.o.   MRN: [id number]   Chief Complaint  Patient presents with  . Annual Exam  . Arm Pain    Left arm pain hard to move at times    HPI  She is here today for a full physical examination. She is followed by Dr. Landry Mellow for her GYN exams.  She was last seen July 2020.   Hypertension This is a chronic problem. The current episode started more than 1 year ago. The problem has been gradually improving since onset. The problem is controlled. Pertinent negatives include no blurred vision, chest pain, palpitations or shortness of breath.     Past Medical History:  Diagnosis Date  . Hypertension      Family History  Problem Relation Age of Onset  . Breast cancer Mother   . Breast cancer Cousin   . Liver disease Father      Current Outpatient Medications:  .  olmesartan-hydrochlorothiazide (BENICAR HCT) 40-25 MG tablet, Take 1 tablet by mouth daily., Disp: 90 tablet, Rfl: 0   No Known Allergies    The patient states she uses none for birth control. Last LMP was Patient's last menstrual period was 08/05/2019.. Negative for Dysmenorrhea Negative for: breast discharge, breast lump(s), breast pain and breast self exam. Associated symptoms include abnormal vaginal bleeding. Pertinent negatives include abnormal bleeding (hematology), anxiety, decreased libido, depression, difficulty falling sleep, dyspareunia, history of infertility, nocturia, sexual dysfunction, sleep disturbances, urinary incontinence, urinary urgency, vaginal discharge and vaginal itching. Diet regular.The patient states her exercise level is  regular.   . The patient's tobacco use is:  Social History   Tobacco Use  Smoking Status Never Smoker  Smokeless Tobacco Never Used  . She has been exposed to passive smoke. The patient's alcohol use is:  Social History   Substance and Sexual Activity  Alcohol Use No  . Additional information:  Last pap was performed in July 2020, next one scheduled for 2021.   Review of Systems  Constitutional: Negative.   HENT: Negative.   Eyes: Negative.  Negative for blurred vision.  Respiratory: Negative.  Negative for shortness of breath.   Cardiovascular: Negative.  Negative for chest pain and palpitations.  Endocrine: Negative.   Genitourinary: Negative.   Musculoskeletal: Positive for arthralgias.       She c/o left arm pain. Started 2 months ago. The discomfort is in her shoulder. She denies fall/trauma. Sometimes she has difficulty with mobility. Sx appear to worsen at night.   Skin: Negative.   Allergic/Immunologic: Negative.   Neurological: Negative.   Hematological: Negative.   Psychiatric/Behavioral: Negative.      Today's Vitals   08/12/19 1001  BP: 138/80  Pulse: 73  Temp: 98.1 F (36.7 C)  TempSrc: Oral  SpO2: 98%  Weight: 224 lb 12.8 oz (102 kg)  Height: 5' 3.6" (1.615 m)  PainSc: 2   PainLoc: Arm   Body mass index is 39.07 kg/m.   Objective:  Physical Exam Vitals signs and nursing note reviewed.  Constitutional:      Appearance: Normal appearance. She is obese.  HENT:     Head: Normocephalic and atraumatic.     Right Ear: Tympanic membrane, ear canal and external ear normal.     Left Ear: Tympanic membrane, ear canal and external ear normal.     Nose: Nose normal.     Mouth/Throat:     Mouth:  Mucous membranes are moist.     Pharynx: Oropharynx is clear.  Eyes:     Extraocular Movements: Extraocular movements intact.     Conjunctiva/sclera: Conjunctivae normal.     Pupils: Pupils are equal, round, and reactive to light.  Neck:     Musculoskeletal: Normal range of motion and neck supple.  Cardiovascular:     Rate and Rhythm: Normal rate and regular rhythm.     Pulses: Normal pulses.     Heart sounds: Normal heart sounds.  Pulmonary:     Effort: Pulmonary effort is normal.     Breath sounds: Normal breath sounds.  Abdominal:     General: Abdomen  is flat. Bowel sounds are normal.     Palpations: Abdomen is soft.  Genitourinary:    Comments: deferred Musculoskeletal: Normal range of motion.     Left shoulder: She exhibits tenderness and pain.     Comments: She has pain with active ROM  Skin:    General: Skin is warm and dry.  Neurological:     General: No focal deficit present.     Mental Status: She is alert and oriented to person, place, and time.  Psychiatric:        Mood and Affect: Mood normal.        Behavior: Behavior normal.         Assessment And Plan:     1. Routine general medical examination at health care facility  A full exam was performed.  Importance of monthly self breast exams was discussed with the patient. PATIENT HAS BEEN ADVISED TO GET 30-45 MINUTES REGULAR EXERCISE NO LESS THAN FOUR TO FIVE DAYS PER WEEK - BOTH WEIGHTBEARING EXERCISES AND AEROBIC ARE RECOMMENDED.  SHE WAS ADVISED TO FOLLOW A HEALTHY DIET WITH AT LEAST SIX FRUITS/VEGGIES PER DAY, DECREASE INTAKE OF RED MEAT, AND TO INCREASE FISH INTAKE TO TWO DAYS PER WEEK.  MEATS/FISH SHOULD NOT BE FRIED, BAKED OR BROILED IS PREFERABLE.  I SUGGEST WEARING SPF 50 SUNSCREEN ON EXPOSED PARTS AND ESPECIALLY WHEN IN THE DIRECT SUNLIGHT FOR AN EXTENDED PERIOD OF TIME.  PLEASE AVOID FAST FOOD RESTAURANTS AND INCREASE YOUR WATER INTAKE.  - CMP14+EGFR - CBC - Lipid panel - Hemoglobin A1c  2. Essential hypertension, benign  Chronic, fair control. She will continue with current meds for now. She is aware that ideal bp is less than 130/80.  EKG performed, no new changes. She will rto in six months for re-evaluation.   - EKG 12-Lead  3. Acute pain of left shoulder  She will try OTC Voltaren gel to affected area 2-3 times per day. Her sx are suggestive of rotator cuff disease. She would like Ortho evaluation. Pt advised she may have radiographic studies during this visit.   - Ambulatory referral to Sports Medicine  4. Class 2 severe obesity due to excess  calories with serious comorbidity and body mass index (BMI) of 39.0 to 39.9 in adult [facility])  Importance of achieving optimal weight to decrease risk of cardiovascular disease and cancers was discussed with the patient in full detail. Importance of regular exercise was discussed with the patient. She is encouraged to strive for BMI less than 32 to decrease cardiac risk.  She was given tips to incorporate more activity into her daily routine - take stairs when possible, park farther away from her job, grocery stores, etc.       Maximino Greenland, MD    THE PATIENT IS ENCOURAGED TO PRACTICE SOCIAL DISTANCING DUE  TO THE COVID-19 PANDEMIC.

## 2019-08-20 ENCOUNTER — Telehealth: Payer: Self-pay

## 2019-08-20 ENCOUNTER — Encounter: Payer: Self-pay | Admitting: Internal Medicine

## 2019-08-20 NOTE — Telephone Encounter (Signed)
I have returned pt call regarding her labs. Left her a v/m to call the office. YRL,RMA

## 2019-08-21 ENCOUNTER — Other Ambulatory Visit: Payer: Self-pay

## 2019-08-21 DIAGNOSIS — I10 Essential (primary) hypertension: Secondary | ICD-10-CM

## 2019-08-21 MED ORDER — OLMESARTAN MEDOXOMIL-HCTZ 40-25 MG PO TABS: 1.0000 | ORAL_TABLET | Freq: Every day | ORAL | 1 refills | Status: DC

## 2020-01-07 ENCOUNTER — Other Ambulatory Visit: Payer: Self-pay | Admitting: Internal Medicine

## 2020-01-07 DIAGNOSIS — Z1231 Encounter for screening mammogram for malignant neoplasm of breast: Secondary | ICD-10-CM

## 2020-01-15 ENCOUNTER — Encounter: Payer: Self-pay | Admitting: Internal Medicine

## 2020-01-16 ENCOUNTER — Encounter: Payer: Self-pay | Admitting: Internal Medicine

## 2020-02-11 ENCOUNTER — Ambulatory Visit: Payer: Managed Care, Other (non HMO)

## 2020-02-11 ENCOUNTER — Ambulatory Visit
Admission: RE | Admit: 2020-02-11 | Discharge: 2020-02-11 | Disposition: A | Discharge status: Home / Self Care | Payer: Managed Care, Other (non HMO) | Source: Ambulatory Visit | Attending: Internal Medicine | Admitting: Internal Medicine

## 2020-02-11 ENCOUNTER — Other Ambulatory Visit: Payer: Self-pay

## 2020-02-11 DIAGNOSIS — Z1231 Encounter for screening mammogram for malignant neoplasm of breast: Secondary | ICD-10-CM

## 2020-02-12 ENCOUNTER — Ambulatory Visit: Payer: Managed Care, Other (non HMO) | Admitting: Internal Medicine

## 2020-02-20 ENCOUNTER — Ambulatory Visit: Payer: Managed Care, Other (non HMO) | Admitting: Internal Medicine

## 2020-02-20 ENCOUNTER — Encounter: Payer: Self-pay | Admitting: Internal Medicine

## 2020-02-20 ENCOUNTER — Other Ambulatory Visit: Payer: Self-pay

## 2020-02-20 VITALS — BP 146/88 | HR 62 | Temp 97.1°F | Ht 63.6 in | Wt 212.6 lb

## 2020-02-20 DIAGNOSIS — I10 Essential (primary) hypertension: Secondary | ICD-10-CM

## 2020-02-20 DIAGNOSIS — Z6836 Body mass index (BMI) 36.0-36.9, adult: Secondary | ICD-10-CM

## 2020-02-20 DIAGNOSIS — R7309 Other abnormal glucose: Secondary | ICD-10-CM

## 2020-02-20 DIAGNOSIS — M25531 Pain in right wrist: Secondary | ICD-10-CM | POA: Diagnosis not present

## 2020-02-20 NOTE — Patient Instructions (Signed)
Calm, magnesium supplement  One teaspoon nightly

## 2020-02-20 NOTE — Progress Notes (Signed)
This visit occurred during the SARS-CoV-2 public health emergency.  Safety protocols were in place, including screening questions prior to the visit, additional usage of staff PPE, and extensive cleaning of exam room while observing appropriate contact time as indicated for disinfecting solutions.  Subjective:     Patient ID: Christina Houston , female    DOB: 15-Aug-1966 , 54 y.o.   MRN: [id number]   Chief Complaint  Patient presents with  . Hypertension    HPI  She presents today for BP check. She reports compliance with meds. Reports that she has made some lifestyle changes -she is moving more and she has made some improvements to her diet.   Hypertension This is a chronic problem. The current episode started more than 1 year ago. The problem has been gradually improving since onset. The problem is uncontrolled. Pertinent negatives include no blurred vision, chest pain, palpitations or shortness of breath. The current treatment provides moderate improvement.     Past Medical History:  Diagnosis Date  . Hypertension      Family History  Problem Relation Age of Onset  . Breast cancer Mother   . Breast cancer Cousin   . Liver disease Father      Current Outpatient Medications:  .  olmesartan-hydrochlorothiazide (BENICAR HCT) 40-25 MG tablet, Take 1 tablet by mouth daily., Disp: 90 tablet, Rfl: 1   No Known Allergies   Review of Systems  Constitutional: Negative.   Eyes: Negative for blurred vision.  Respiratory: Negative.  Negative for shortness of breath.   Cardiovascular: Negative.  Negative for chest pain and palpitations.  Gastrointestinal: Negative.   Musculoskeletal: Positive for arthralgias.       She c/o r wrist pain. She denies fall/trauma. Described as a burning sensation. Denies weakness. Works on Teaching laboratory technician a lot. Not sure what triggers her sx.   Neurological: Negative.   Psychiatric/Behavioral: Negative.      Today's Vitals   02/20/20 1559  BP:  (!) 146/88  Pulse: 62  Temp: (!) 97.1 F (36.2 C)  TempSrc: Oral  Weight: 212 lb 9.6 oz (96.4 kg)  Height: 5' 3.6" (1.615 m)   Body mass index is 36.95 kg/m.   Wt Readings from Last 3 Encounters:  02/20/20 212 lb 9.6 oz (96.4 kg)  08/12/19 224 lb 12.8 oz (102 kg)  02/28/19 226 lb 9.6 oz (102.8 kg)     Objective:  Physical Exam Vitals and nursing note reviewed.  Constitutional:      Appearance: Normal appearance.  HENT:     Head: Normocephalic and atraumatic.  Cardiovascular:     Rate and Rhythm: Normal rate and regular rhythm.     Heart sounds: Normal heart sounds.  Pulmonary:     Effort: Pulmonary effort is normal.     Breath sounds: Normal breath sounds.  Musculoskeletal:     Comments: Neg Tinel's sign, neg Phalen's sign  Skin:    General: Skin is warm.  Neurological:     General: No focal deficit present.     Mental Status: She is alert.  Psychiatric:        Mood and Affect: Mood normal.        Behavior: Behavior normal.         Assessment And Plan:     1. Essential hypertension, benign  Chronic, fair control. I did suggest adding another medication to her regimen; however, she wants to continue to work on lifestyle changes. I will check renal function today.   -  BMP8+EGFR  2. Other abnormal glucose  HER A1C HAS BEEN ELEVATED IN THE PAST. I WILL CHECK AN A1C, BMET TODAY. SHE WAS ENCOURAGED TO AVOID SUGARY BEVERAGES AND PROCESSED FOODS INCLUDNG BREADS, RICE AND PASTA.  - Hemoglobin A1c  3. Right wrist pain  She was given rx wrist splint. She will let me know if her sx persist/worsen. If so, I will schedule her for nerve conduction study.   4. Class 2 severe obesity due to excess calories with serious comorbidity and body mass index (BMI) of 36.0 to 36.9 in adult [facility])  She was congratulated on her 12 pound weight loss over the past six months. She is encouraged to keep up the great work. She is encouraged to strive for BMI less tahn 30 to decrease  caridac risk.    Maximino Greenland, MD    THE PATIENT IS ENCOURAGED TO PRACTICE SOCIAL DISTANCING DUE TO THE COVID-19 PANDEMIC.

## 2020-02-21 LAB — BMP8+EGFR
BUN/Creatinine Ratio: 7 — ABNORMAL LOW (ref 9–23)
BUN: 7 mg/dL (ref 6–24)
CO2: 22 mmol/L (ref 20–29)
Calcium: 9.5 mg/dL (ref 8.7–10.2)
Chloride: 103 mmol/L (ref 96–106)
Creatinine, Ser: 0.94 mg/dL (ref 0.57–1.00)
GFR calc Af Amer: 81 mL/min/{1.73_m2} (ref >59)
GFR calc non Af Amer: 70 mL/min/{1.73_m2} (ref >59)
Glucose: 85 mg/dL (ref 65–99)
Potassium: 4.4 mmol/L (ref 3.5–5.2)
Sodium: 139 mmol/L (ref 134–144)

## 2020-02-21 LAB — HEMOGLOBIN A1C
Est. average glucose Bld gHb Est-mCnc: 117 mg/dL
Hgb A1c MFr Bld: 5.7 % — ABNORMAL HIGH (ref 4.8–5.6)

## 2020-07-13 ENCOUNTER — Encounter: Payer: Self-pay | Admitting: Internal Medicine

## 2020-07-22 ENCOUNTER — Encounter: Payer: Self-pay | Admitting: Internal Medicine

## 2020-08-12 ENCOUNTER — Encounter: Payer: Self-pay | Admitting: Internal Medicine

## 2020-08-24 ENCOUNTER — Encounter: Payer: Managed Care, Other (non HMO) | Admitting: Internal Medicine

## 2020-09-11 ENCOUNTER — Other Ambulatory Visit: Payer: Self-pay | Admitting: Internal Medicine

## 2020-09-11 DIAGNOSIS — I10 Essential (primary) hypertension: Secondary | ICD-10-CM

## 2020-10-27 ENCOUNTER — Other Ambulatory Visit: Payer: Self-pay

## 2020-10-27 ENCOUNTER — Ambulatory Visit (HOSPITAL_COMMUNITY)
Admission: EM | Admit: 2020-10-27 | Discharge: 2020-10-27 | Disposition: A | Discharge status: Home / Self Care | Payer: Self-pay

## 2021-02-23 ENCOUNTER — Telehealth: Payer: Self-pay

## 2021-02-23 NOTE — Telephone Encounter (Addendum)
Patient returned call. Patient to download [facility] Mammography Scholarship [facility] Application, return either by fax, mail, or drop off at [id number].   Patient left message, requesting information regarding any programs offering free or low-cost breast exams/mammograms. Call returned, left message on voicemail requesting return call.

## 2021-03-23 ENCOUNTER — Other Ambulatory Visit: Payer: Self-pay | Admitting: Obstetrics and Gynecology

## 2021-03-23 DIAGNOSIS — Z1231 Encounter for screening mammogram for malignant neoplasm of breast: Secondary | ICD-10-CM

## 2021-04-15 ENCOUNTER — Ambulatory Visit: Payer: No Typology Code available for payment source | Admitting: *Deleted

## 2021-04-15 ENCOUNTER — Other Ambulatory Visit: Payer: Self-pay

## 2021-04-15 ENCOUNTER — Ambulatory Visit
Admission: RE | Admit: 2021-04-15 | Discharge: 2021-04-15 | Disposition: A | Discharge status: Home / Self Care | Payer: Self-pay | Source: Ambulatory Visit | Attending: Obstetrics and Gynecology | Admitting: Obstetrics and Gynecology

## 2021-04-15 VITALS — BP 118/74 | Wt 223.9 lb

## 2021-04-15 DIAGNOSIS — Z1239 Encounter for other screening for malignant neoplasm of breast: Secondary | ICD-10-CM

## 2021-04-15 DIAGNOSIS — Z1231 Encounter for screening mammogram for malignant neoplasm of breast: Secondary | ICD-10-CM

## 2021-04-15 NOTE — Progress Notes (Signed)
Christina Houston is a 55 y.o. female who presents to [facility] clinic today with no complaints.    Pap Smear: Pap smear not completed today. Last Pap smear was 07/01/2019 at [facility] clinic and was normal with negative HPV. Per patient has no history of an abnormal Pap smear. Last Pap smear result is available in Epic.   Physical exam: Breasts Breasts symmetrical. No skin abnormalities bilateral breasts. No nipple retraction bilateral breasts. No nipple discharge bilateral breasts. No lymphadenopathy. No lumps palpated bilateral breasts. No complaints of pain or tenderness on exam.      MM DIGITAL SCREENING BILATERAL  Result Date: 01/09/2017 CLINICAL DATA:  Screening. EXAM: DIGITAL SCREENING BILATERAL MAMMOGRAM WITH CAD COMPARISON:  Previous exam(s). ACR Breast Density Category c: The breast tissue is heterogeneously dense, which may obscure small masses. FINDINGS: There are no findings suspicious for malignancy. Images were processed with CAD. IMPRESSION: No mammographic evidence of malignancy. A result letter of this screening mammogram will be mailed directly to the patient. RECOMMENDATION: Screening mammogram in one year. (Code:SM-B-01Y) BI-RADS CATEGORY  1: Negative. Electronically Signed   By: Dalphine Handing M.D.   On: 01/09/2017 08:11   MM 3D SCREEN BREAST BILATERAL  Result Date: 02/12/2020 CLINICAL DATA:  Screening. EXAM: DIGITAL SCREENING BILATERAL MAMMOGRAM WITH TOMO AND CAD COMPARISON:  Previous exam(s). ACR Breast Density Category c: The breast tissue is heterogeneously dense, which may obscure small masses. FINDINGS: There are no findings suspicious for malignancy. Images were processed with CAD. IMPRESSION: No mammographic evidence of malignancy. A result letter of this screening mammogram will be mailed directly to the patient. RECOMMENDATION: Screening mammogram in one year. (Code:SM-B-01Y) BI-RADS CATEGORY  1: Negative. Electronically Signed   By: Sande Brothers M.D.   On:  02/12/2020 13:54   MM 3D SCREEN BREAST BILATERAL  Result Date: 02/11/2019 CLINICAL DATA:  Screening. EXAM: DIGITAL SCREENING BILATERAL MAMMOGRAM WITH TOMO AND CAD COMPARISON:  Previous exam(s). ACR Breast Density Category c: The breast tissue is heterogeneously dense, which may obscure small masses. FINDINGS: There are no findings suspicious for malignancy. Images were processed with CAD. IMPRESSION: No mammographic evidence of malignancy. A result letter of this screening mammogram will be mailed directly to the patient. RECOMMENDATION: Screening mammogram in one year. (Code:SM-B-01Y) BI-RADS CATEGORY  1: Negative. Electronically Signed   By: Baird Lyons M.D.   On: 02/11/2019 12:24   MM SCREENING BREAST TOMO BILATERAL  Result Date: 01/10/2018 CLINICAL DATA:  Screening. EXAM: 2D DIGITAL SCREENING BILATERAL MAMMOGRAM WITH 3D TOMO WITH CAD COMPARISON:  Previous exam(s). ACR Breast Density Category b: There are scattered areas of fibroglandular density. FINDINGS: There are no findings suspicious for malignancy. Images were processed with CAD. IMPRESSION: No mammographic evidence of malignancy. A result letter of this screening mammogram will be mailed directly to the patient. RECOMMENDATION: Screening mammogram in one year. (Code:SM-B-01Y) BI-RADS CATEGORY  1: Negative. Electronically Signed   By: Amie Portland M.D.   On: 01/10/2018 07:49    Pelvic/Bimanual Pap is not indicated today per [facility]CP guidelines.    Smoking History: Patient has never smoked.   Patient Navigation: Patient education provided. Access to services provided for patient through [facility] program.   Colorectal Cancer Screening: Per patient has had colonoscopy completed on January 2020. No complaints today.    Breast and Cervical Cancer Risk Assessment: Patient has family history of her mother and first maternal cousin having breast cancer. Patient has no known genetic mutations or history of radiation treatment to the chest  before  age 60. Patient does not have history of cervical dysplasia, immunocompromised, or DES exposure in-utero.  Risk Assessment    Risk Scores      04/15/2021   Last edited by: Narda Rutherford, LPN   5-year risk: 2 %   Lifetime risk: 13 %         A: [facility] exam without pap smear No complaints.  P: Referred patient to the [facility] for a screening mammogram on the mobile unit. Appointment scheduled Thursday, Apr 15, 2021 at 1300.  Priscille Heidelberg, RN 04/15/2021 1:03 PM

## 2021-04-15 NOTE — Patient Instructions (Signed)
Explained breast self awareness with Christina Houston. Patient did not need a Pap smear today due to last Pap smear and HPV typing was 07/01/2019. Let her know [facility]CP will cover Pap smears and HPV typing every 5 years unless has a history of abnormal Pap smears. Referred patient to the [facility] for a screening mammogram on the mobile unit. Appointment scheduled Thursday, Apr 15, 2021 at 1300. Patient escorted to the mobile unit following B[facility]CP appointment for her screening mammogram. Let patient know the Breast Center will follow up with her within the next couple weeks with results of her mammogram by letter or phone. Christina Houston verbalized understanding.  Christina Houston, Christina Maser, RN 1:03 PM

## 2021-08-24 ENCOUNTER — Encounter: Payer: Self-pay | Admitting: Nurse Practitioner

## 2021-08-24 ENCOUNTER — Other Ambulatory Visit: Payer: Self-pay

## 2021-08-24 ENCOUNTER — Ambulatory Visit (INDEPENDENT_AMBULATORY_CARE_PROVIDER_SITE_OTHER): Payer: Self-pay | Admitting: Nurse Practitioner

## 2021-08-24 VITALS — BP 140/88 | HR 75 | Temp 98.4°F | Ht 63.0 in | Wt 211.4 lb

## 2021-08-24 DIAGNOSIS — R7309 Other abnormal glucose: Secondary | ICD-10-CM

## 2021-08-24 DIAGNOSIS — Z6836 Body mass index (BMI) 36.0-36.9, adult: Secondary | ICD-10-CM

## 2021-08-24 DIAGNOSIS — Z79899 Other long term (current) drug therapy: Secondary | ICD-10-CM

## 2021-08-24 DIAGNOSIS — E7800 Pure hypercholesterolemia, unspecified: Secondary | ICD-10-CM

## 2021-08-24 DIAGNOSIS — I10 Essential (primary) hypertension: Secondary | ICD-10-CM

## 2021-08-24 DIAGNOSIS — Z111 Encounter for screening for respiratory tuberculosis: Secondary | ICD-10-CM

## 2021-08-24 NOTE — Progress Notes (Signed)
I,Tasnia Spegal,acting as a Education administrator for Minette Brine, FNP.,have documented all relevant documentation on the behalf of Minette Brine, FNP,as directed by  Minette Brine, FNP while in the presence of Minette Brine, [place].  This visit occurred during the SARS-CoV-2 public health emergency.  Safety protocols were in place, including screening questions prior to the visit, additional usage of staff PPE, and extensive cleaning of exam room while observing appropriate contact time as indicated for disinfecting solutions.  Subjective:     Patient ID: Christina Houston , female    DOB: October 31, 1966 , 55 y.o.   MRN: [id number]   Chief Complaint  Patient presents with   PPD Placement    HPI  Pt presents today for TB skin test, she has forms needing to be filled out for work as well. She would not like her flu shot today. (We are out of TB, she would have to get QuantiFERON)  Wt Readings from Last 3 Encounters: 08/24/21 : 211 lb 6.4 oz (95.9 kg) 04/15/21 : 223 lb 14.4 oz (101.6 kg) 02/20/20 : 212 lb 9.6 oz (96.4 kg)    Hypertension This is a chronic problem. The current episode started more than 1 year ago. The problem is uncontrolled. Pertinent negatives include no anxiety, chest pain, headaches or palpitations. Past treatments include ACE inhibitors and diuretics. There are no compliance problems.  There is no history of angina. There is no history of chronic renal disease.    Past Medical History:  Diagnosis Date   Hypertension      Family History  Problem Relation Age of Onset   Breast cancer Mother    Breast cancer Cousin    Liver disease Father      Current Outpatient Medications:    olmesartan-hydrochlorothiazide (BENICAR HCT) 40-25 MG tablet, TAKE 1 TABLET BY MOUTH  DAILY, Disp: 90 tablet, Rfl: 3   No Known Allergies   Review of Systems  Constitutional: Negative.   Respiratory: Negative.    Cardiovascular: Negative.  Negative for chest pain, palpitations and leg swelling.   Neurological: Negative.  Negative for dizziness and headaches.  Psychiatric/Behavioral: Negative.      Today's Vitals   08/24/21 0956  BP: 140/88  Pulse: 75  Temp: 98.4 F (36.9 C)  SpO2: 90%  Weight: 211 lb 6.4 oz (95.9 kg)  Height: 5' 3"  (1.6 m)  PainSc: 0-No pain   Body mass index is 37.45 kg/m.   Objective:  Physical Exam Vitals reviewed.  Constitutional:      General: She is not in acute distress.    Appearance: Normal appearance. She is well-developed.  HENT:     Head: Normocephalic and atraumatic.  Eyes:     Pupils: Pupils are equal, round, and reactive to light.  Cardiovascular:     Rate and Rhythm: Normal rate and regular rhythm.     Pulses: Normal pulses.     Heart sounds: Normal heart sounds. No murmur heard. Pulmonary:     Effort: Pulmonary effort is normal. No respiratory distress.     Breath sounds: Normal breath sounds. No wheezing.  Musculoskeletal:        General: Normal range of motion.  Skin:    General: Skin is warm and dry.     Capillary Refill: Capillary refill takes less than 2 seconds.  Neurological:     General: No focal deficit present.     Mental Status: She is alert and oriented to person, place, and time.     Cranial Nerves: No  cranial nerve deficit.  Psychiatric:        Mood and Affect: Mood normal.        Behavior: Behavior normal.        Thought Content: Thought content normal.        Judgment: Judgment normal.        Assessment And Plan:     1. Essential hypertension, benign Comments: Blood pressure is slightly elevated, she has not taken her medications today - BMP8+eGFR  2. Other abnormal glucose Comments: Stable, no current medications - Hemoglobin A1c  3. Elevated LDL cholesterol level Comments: Slightly elevated at last visit,will check lipids today - Lipid panel  4. Class 2 severe obesity due to excess calories with serious comorbidity and body mass index (BMI) of 36.0 to 36.9 in adult [facility]) Comments: She has  lost 12 lbs since May 2022  5. Visit for TB skin test Comments: TB quantiferon done due to no TB skin test availiable. - QuantiFERON-TB Gold Plus  6. Other long term (current) drug therapy - CBC     Patient was given opportunity to ask questions. Patient verbalized understanding of the plan and was able to repeat key elements of the plan. All questions were answered to their satisfaction.  Minette Brine, FNP   I, Minette Brine, FNP, have reviewed all documentation for this visit. The documentation on 08/24/21 for the exam, diagnosis, procedures, and orders are all accurate and complete.   IF YOU HAVE BEEN REFERRED TO A SPECIALIST, IT MAY TAKE 1-2 WEEKS TO SCHEDULE/PROCESS THE REFERRAL. IF YOU HAVE NOT HEARD FROM US/SPECIALIST IN TWO WEEKS, PLEASE GIVE [place] A CALL AT [phone].   THE PATIENT IS ENCOURAGED TO PRACTICE SOCIAL DISTANCING DUE TO THE COVID-19 PANDEMIC.

## 2021-08-24 NOTE — Patient Instructions (Signed)
Tuberculin Skin Test Why am I having this test? The tuberculin skin test is used to check whether a person has been exposed to the bacteria that causes tuberculosis (TB) (Mycobacterium tuberculosis). Tuberculosis is a bacterial infection that usually affects the lungs but can affect other parts of the body. You may have a tuberculin skin test if: You have possible symptoms of TB, such as: Coughing up blood, mucus from the lungs (sputum), or both. A cough that lasts three weeks or longer. Chest pain, or pain while breathing or coughing. Unexplained weight loss. Fatigue and weakness. Fever, sweating, and chills. Loss of appetite. You are at high risk for getting TB. You may be at high risk if you: Inject illegal drugs or share needles. Have HIV or other diseases that affect the body's disease-fighting system (immune system). Work in a health care facility. Live in a high-risk community, such as a homeless shelter, nursing home, or correctional facility. Have had contact with someone who has TB. Are from or have traveled to a country where TB is common. If you are at high risk, you may need to have regular TB screenings. TB screening may be required when starting a new job, such as becoming a health care worker or a teacher. Colleges or universities may require TB screening fornew students. What is being tested? This test checks for the presence of TB antibodies in the body. Antibodies are part of the body's immune system. After you get an infection, your body makes antibodies that stay in your body after you recover and protect you fromgetting the same infection again. Tell a health care provider about: Any allergies you have. All medicines you are taking, including vitamins, herbs, eye drops, creams, and over-the-counter medicines. Any blood disorders you have. Any surgeries you have had. Any medical conditions you have. Whether you are pregnant or may be pregnant. What happens during the  test?  Your health care provider will inject a solution called PPD (purified protein derivative) under the first layer of skin on your arm. This causes a small, blister-like bump to form over the area temporarily. PPD is made from the bacteria that causes TB. PPD causes your immune system to react, but it does not get you sickwith TB. You may feel mild stinging as this happens. Afterward, the area may itch orburn. How are the results reported? To get your test results, you will need to see your health care provider again within 2-3 days after you received the injection. It is important to follow your health care provider's instructions about when to be seen again. If you are not seen within 2-3 days, you may need to have the test repeated. At your follow-up visit, your health care provider will measure the area where the PPD was injected to see if the bump has gotten larger due to swelling. Your results will be reported as positive or negative. If the bump has disappeared or is small, your test result is negative. Negative means that you do not have the antibodies. If the bump is large, your test result is positive. Positive means that you have the antibodies. Swelling is caused by the antibodies reacting with the PPD. The skin may also turn red around the bump. A false-positive result can occur. A false positive is incorrect because itmeans that a condition is present when it is not. A false-negative result can occur. A false negative is incorrect because it means that a condition is not present when it is. False negatives are   rare and are more likely to occur in older people and in people who have weakened immunesystems. What do the results mean? A negative result means that it is unlikely that you have TB or that you have been exposed to TB bacteria. This test may be repeated, or you may have a blood test to check for TB. This is because your body may not react to the tuberculinskin test until several  weeks after exposure to TB bacteria. A positive result means that you have been exposed to TB, and you may need more tests to determine if you have: Active TB, also called TB disease. This means that you have TB symptoms and your infection can spread to others (you are contagious). Latent TB. This means that you do not have any symptoms of TB and you are not contagious. Latent TB can turn into active TB. Talk with your health care provider about what your results mean. Questions to ask your health care provider Ask your health care provider, or the department that is doing the test: When will my results be ready? How will I get my results? What are my treatment options? What other tests do I need? What are my next steps? Summary The tuberculin skin test is used to check whether a person has been exposed to the bacteria that causes tuberculosis (TB). Your health care provider will inject a solution known as PPD (purified protein derivative) under the first layer of skin on your arm. After 2-3 days, your health care provider will measure the area where the PPD was injected to see if the bump has gotten larger due to swelling. Your results will be reported as positive or negative. A positive result means that you have been exposed to TB. A negative result means that it is unlikely that you have TB or that you have been exposed to TB bacteria. This information is not intended to replace advice given to you by your health care provider. Make sure you discuss any questions you have with your healthcare provider. Document Revised: 08/20/2020 Document Reviewed: 07/30/2020 Elsevier Patient Education  2022 [employer] Inc.  

## 2021-08-27 ENCOUNTER — Encounter: Payer: Self-pay | Admitting: Internal Medicine

## 2021-08-27 LAB — CBC
Hematocrit: 31.3 % — ABNORMAL LOW (ref 34.0–46.6)
Hemoglobin: 10 g/dL — ABNORMAL LOW (ref 11.1–15.9)
MCH: 25.1 pg — ABNORMAL LOW (ref 26.6–33.0)
MCHC: 31.9 g/dL (ref 31.5–35.7)
MCV: 78 fL — ABNORMAL LOW (ref 79–97)
Platelets: 376 10*3/uL (ref 150–450)
RBC: 3.99 x10E6/uL (ref 3.77–5.28)
RDW: 14.6 % (ref 11.7–15.4)
WBC: 4.6 10*3/uL (ref 3.4–10.8)

## 2021-08-27 LAB — BMP8+EGFR
BUN/Creatinine Ratio: 10 (ref 9–23)
BUN: 11 mg/dL (ref 6–24)
CO2: 23 mmol/L (ref 20–29)
Calcium: 10.1 mg/dL (ref 8.7–10.2)
Chloride: 101 mmol/L (ref 96–106)
Creatinine, Ser: 1.15 mg/dL — ABNORMAL HIGH (ref 0.57–1.00)
Glucose: 86 mg/dL (ref 65–99)
Potassium: 4 mmol/L (ref 3.5–5.2)
Sodium: 137 mmol/L (ref 134–144)
eGFR: 57 mL/min/{1.73_m2} — ABNORMAL LOW (ref >59)

## 2021-08-27 LAB — QUANTIFERON-TB GOLD PLUS
QuantiFERON Mitogen Value: 5.79 IU/mL
QuantiFERON Nil Value: 0.01 IU/mL
QuantiFERON TB1 Ag Value: 0.12 IU/mL
QuantiFERON TB2 Ag Value: 0.06 IU/mL
QuantiFERON-TB Gold Plus: NEGATIVE

## 2021-08-27 LAB — LIPID PANEL
Chol/HDL Ratio: 2.4 ratio (ref 0.0–4.4)
Cholesterol, Total: 198 mg/dL (ref 100–199)
HDL: 84 mg/dL (ref >39)
LDL Chol Calc (NIH): 103 mg/dL — ABNORMAL HIGH (ref 0–99)
Triglycerides: 61 mg/dL (ref 0–149)
VLDL Cholesterol Cal: 11 mg/dL (ref 5–40)

## 2021-08-27 LAB — HEMOGLOBIN A1C
Est. average glucose Bld gHb Est-mCnc: 126 mg/dL
Hgb A1c MFr Bld: 6 % — ABNORMAL HIGH (ref 4.8–5.6)

## 2021-08-31 ENCOUNTER — Encounter: Payer: Self-pay | Admitting: Nurse Practitioner

## 2021-08-31 LAB — MEASLES/MUMPS/RUBELLA IMMUNITY
MUMPS ABS, IGG: 226 AU/mL (ref >10.9)
RUBEOLA AB, IGG: 214 AU/mL (ref >16.4)
Rubella Antibodies, IGG: 2.93 index (ref >0.99)

## 2021-08-31 LAB — SPECIMEN STATUS REPORT

## 2021-08-31 LAB — HEPATITIS B SURFACE ANTIBODY,QUALITATIVE: Hep B Surface Ab, Qual: NONREACTIVE

## 2021-10-19 ENCOUNTER — Encounter: Payer: Self-pay | Admitting: Internal Medicine

## 2021-10-19 ENCOUNTER — Telehealth: Payer: No Typology Code available for payment source | Admitting: Family Medicine

## 2021-10-19 DIAGNOSIS — R058 Other specified cough: Secondary | ICD-10-CM

## 2021-10-19 MED ORDER — BENZONATATE 100 MG PO CAPS: 100.0000 mg | ORAL_CAPSULE | Freq: Two times a day (BID) | ORAL | 0 refills | Status: DC | PRN

## 2021-10-19 MED ORDER — PROMETHAZINE-DM 6.25-15 MG/5ML PO SYRP: 2.5000 mL | ORAL_SOLUTION | Freq: Three times a day (TID) | ORAL | 0 refills | Status: DC | PRN

## 2021-10-19 NOTE — Progress Notes (Signed)
Virtual Visit Consent   Christina Houston, you are scheduled for a virtual visit with a [facility] provider today.     Just as with appointments in the office, your consent must be obtained to participate.  Your consent will be active for this visit and any virtual visit you may have with one of our providers in the next 365 days.     If you have a MyChart account, a copy of this consent can be sent to you electronically.  All virtual visits are billed to your insurance company just like a traditional visit in the office.    As this is a virtual visit, video technology does not allow for your provider to perform a traditional examination.  This may limit your provider's ability to fully assess your condition.  If your provider identifies any concerns that need to be evaluated in person or the need to arrange testing (such as labs, EKG, etc.), we will make arrangements to do so.     Although advances in technology are sophisticated, we cannot ensure that it will always work on either your end or our end.  If the connection with a video visit is poor, the visit may have to be switched to a telephone visit.  With either a video or telephone visit, we are not always able to ensure that we have a secure connection.     I need to obtain your verbal consent now.   Are you willing to proceed with your visit today?    Christina Houston has provided verbal consent on 10/19/2021 for a virtual visit (video or telephone).   Freddy Finner, NP   Date: 10/19/2021 9:33 AM   Virtual Visit via Video Note   I, Freddy Finner, connected with  Christina Houston  ([id number], 12-05-1966) on 10/19/21 at  9:30 AM EST by a video-enabled telemedicine application and verified that I am speaking with the correct person using two identifiers.  Location: Patient: Virtual Visit Location Patient: Home Provider: Virtual Visit Location Provider: Home Office   I discussed the limitations of  evaluation and management by telemedicine and the availability of in person appointments. The patient expressed understanding and agreed to proceed.    History of Present Illness: Christina Houston is a 55 y.o. who identifies as a female who was assigned female at birth, and is being seen today for cough that is on going. Took a home covid test it was negative around this time. 10/07/21 Cough started Thursday the 27th of Oct. Not consistent. Has tried cough syrup, tea, honey and lemon, salt gargle.  Reports from dry cough, no mucus production. Had a fever for like 3 days and that is when the cough started.  Reports she did have nasal congestion at the beginning- it has cleared now. Only coughing during the day- very nagging.   Denies history of heartburn.   No other concerns to discussed at this time.   Problems:  Patient Active Problem List   Diagnosis Date Noted   Essential hypertension, benign 08/12/2019    Allergies: No Known Allergies Medications:  Current Outpatient Medications:    olmesartan-hydrochlorothiazide (BENICAR HCT) 40-25 MG tablet, TAKE 1 TABLET BY MOUTH  DAILY, Disp: 90 tablet, Rfl: 3  Observations/Objective: Patient is well-developed, well-nourished in no acute distress.  Resting comfortably  at home.  Head is normocephalic, atraumatic.  No labored breathing.  Speech is clear and coherent with logical content.  Patient is alert and oriented at  baseline.  Dry cough noted during visit.  Assessment and Plan: 1. Post-viral cough syndrome Recent viral illness, unsure what it was.  Sounds like a lingering post viral cough Will treat as such OTC reviewed and provided with scripts below  Education on duration of these lingering coughs provided  - benzonatate (TESSALON) 100 MG capsule; Take 1 capsule (100 mg total) by mouth 2 (two) times daily as needed for cough.  Dispense: 20 capsule; Refill: 0 - promethazine-dextromethorphan (PROMETHAZINE-DM)  6.25-15 MG/5ML syrup; Take 2.5 mLs by mouth 3 (three) times daily as needed for cough.  Dispense: 118 mL; Refill: 0    Reviewed side effects, risks and benefits of medication.    Patient acknowledged agreement and understanding of the plan.   I discussed the assessment and treatment plan with the patient. The patient was provided an opportunity to ask questions and all were answered. The patient agreed with the plan and demonstrated an understanding of the instructions.   The patient was advised to call back or seek an in-person evaluation if the symptoms worsen or if the condition fails to improve as anticipated.   The above assessment and management plan was discussed with the patient. The patient verbalized understanding of and has agreed to the management plan. Patient is aware to call the clinic if symptoms persist or worsen. Patient is aware when to return to the clinic for a follow-up visit. Patient educated on when it is appropriate to go to the emergency department.    Follow Up Instructions: I discussed the assessment and treatment plan with the patient. The patient was provided an opportunity to ask questions and all were answered. The patient agreed with the plan and demonstrated an understanding of the instructions.  A copy of instructions were sent to the patient via MyChart unless otherwise noted below.   The patient was advised to call back or seek an in-person evaluation if the symptoms worsen or if the condition fails to improve as anticipated.  Time:  I spent 10 minutes with the patient via telehealth technology discussing the above problems/concerns.    Freddy Finner, NP

## 2021-10-19 NOTE — Patient Instructions (Addendum)
I appreciate the opportunity to provide you with care for your health and wellness.  I hope feel better- soon.  Post viral coughs can last for several weeks  Please continue to practice social distancing to keep you, your family, and our community safe.  If you must go out, please wear a mask and practice good handwashing.  Have a wonderful day. With Gratitude, Tereasa Coop, DNP, AGNP-BC

## 2021-11-22 ENCOUNTER — Encounter: Payer: Self-pay | Admitting: Nurse Practitioner

## 2021-12-08 ENCOUNTER — Other Ambulatory Visit: Payer: Self-pay | Admitting: Internal Medicine

## 2021-12-08 DIAGNOSIS — Z1231 Encounter for screening mammogram for malignant neoplasm of breast: Secondary | ICD-10-CM

## 2021-12-28 ENCOUNTER — Encounter: Payer: Self-pay | Admitting: Nurse Practitioner

## 2022-01-31 ENCOUNTER — Encounter: Payer: Self-pay | Admitting: Nurse Practitioner

## 2022-01-31 ENCOUNTER — Ambulatory Visit (INDEPENDENT_AMBULATORY_CARE_PROVIDER_SITE_OTHER): Payer: BC Managed Care – PPO | Admitting: Nurse Practitioner

## 2022-01-31 ENCOUNTER — Other Ambulatory Visit: Payer: Self-pay

## 2022-01-31 VITALS — BP 140/96 | HR 62 | Temp 98.0°F | Ht 64.0 in | Wt 220.4 lb

## 2022-01-31 DIAGNOSIS — I10 Essential (primary) hypertension: Secondary | ICD-10-CM | POA: Diagnosis not present

## 2022-01-31 DIAGNOSIS — E7800 Pure hypercholesterolemia, unspecified: Secondary | ICD-10-CM

## 2022-01-31 DIAGNOSIS — Z79899 Other long term (current) drug therapy: Secondary | ICD-10-CM

## 2022-01-31 DIAGNOSIS — Z0000 Encounter for general adult medical examination without abnormal findings: Secondary | ICD-10-CM | POA: Diagnosis not present

## 2022-01-31 DIAGNOSIS — Z2821 Immunization not carried out because of patient refusal: Secondary | ICD-10-CM

## 2022-01-31 DIAGNOSIS — Z6837 Body mass index (BMI) 37.0-37.9, adult: Secondary | ICD-10-CM

## 2022-01-31 DIAGNOSIS — R5383 Other fatigue: Secondary | ICD-10-CM

## 2022-01-31 DIAGNOSIS — G4709 Other insomnia: Secondary | ICD-10-CM | POA: Diagnosis not present

## 2022-01-31 DIAGNOSIS — R7309 Other abnormal glucose: Secondary | ICD-10-CM | POA: Diagnosis not present

## 2022-01-31 DIAGNOSIS — Z1159 Encounter for screening for other viral diseases: Secondary | ICD-10-CM

## 2022-01-31 LAB — POCT URINALYSIS DIPSTICK
Bilirubin, UA: NEGATIVE
Blood, UA: NEGATIVE
Glucose, UA: NEGATIVE
Ketones, UA: NEGATIVE
Leukocytes, UA: NEGATIVE
Nitrite, UA: NEGATIVE
Protein, UA: NEGATIVE
Spec Grav, UA: 1.01 (ref 1.010–1.025)
Urobilinogen, UA: 0.2 E.U./dL
pH, UA: 6.5 (ref 5.0–8.0)

## 2022-01-31 MED ORDER — OLMESARTAN MEDOXOMIL-HCTZ 40-25 MG PO TABS: 1.0000 | ORAL_TABLET | Freq: Every day | ORAL | 1 refills | Status: DC

## 2022-01-31 NOTE — Patient Instructions (Addendum)
Health Maintenance, Female °Adopting a healthy lifestyle and getting preventive care are important in promoting health and wellness. Ask your health care provider about: °The right schedule for you to have regular tests and exams. °Things you can do on your own to prevent diseases and keep yourself healthy. °What should I know about diet, weight, and exercise? °Eat a healthy diet ° °Eat a diet that includes plenty of vegetables, fruits, low-fat dairy products, and lean protein. °Do not eat a lot of foods that are high in solid fats, added sugars, or sodium. °Maintain a healthy weight °Body mass index (BMI) is used to identify weight problems. It estimates body fat based on height and weight. Your health care provider can help determine your BMI and help you achieve or maintain a healthy weight. °Get regular exercise °Get regular exercise. This is one of the most important things you can do for your health. Most adults should: °Exercise for at least 150 minutes each week. The exercise should increase your heart rate and make you sweat (moderate-intensity exercise). °Do strengthening exercises at least twice a week. This is in addition to the moderate-intensity exercise. °Spend less time sitting. Even light physical activity can be beneficial. °Watch cholesterol and blood lipids °Have your blood tested for lipids and cholesterol at 54 years of age, then have this test every 5 years. °Have your cholesterol levels checked more often if: °Your lipid or cholesterol levels are high. °You are older than 54 years of age. °You are at high risk for heart disease. °What should I know about cancer screening? °Depending on your health history and family history, you may need to have cancer screening at various ages. This may include screening for: °Breast cancer. °Cervical cancer. °Colorectal cancer. °Skin cancer. °Lung cancer. °What should I know about heart disease, diabetes, and high blood pressure? °Blood pressure and heart  disease °High blood pressure causes heart disease and increases the risk of stroke. This is more likely to develop in people who have high blood pressure readings or are overweight. °Have your blood pressure checked: °Every 3-5 years if you are 18-39 years of age. °Every year if you are 40 years old or older. °Diabetes °Have regular diabetes screenings. This checks your fasting blood sugar level. Have the screening done: °Once every three years after age 40 if you are at a normal weight and have a low risk for diabetes. °More often and at a younger age if you are overweight or have a high risk for diabetes. °What should I know about preventing infection? °Hepatitis B °If you have a higher risk for hepatitis B, you should be screened for this virus. Talk with your health care provider to find out if you are at risk for hepatitis B infection. °Hepatitis C °Testing is recommended for: °Everyone born from 1945 through 1965. °Anyone with known risk factors for hepatitis C. °Sexually transmitted infections (STIs) °Get screened for STIs, including gonorrhea and chlamydia, if: °You are sexually active and are younger than 54 years of age. °You are older than 54 years of age and your health care provider tells you that you are at risk for this type of infection. °Your sexual activity has changed since you were last screened, and you are at increased risk for chlamydia or gonorrhea. Ask your health care provider if you are at risk. °Ask your health care provider about whether you are at high risk for HIV. Your health care provider may recommend a prescription medicine to help prevent HIV   infection. If you choose to take medicine to prevent HIV, you should first get tested for HIV. You should then be tested every 3 months for as long as you are taking the medicine. Pregnancy If you are about to stop having your period (premenopausal) and you may become pregnant, seek counseling before you get pregnant. Take 400 to 800  micrograms (mcg) of folic acid every day if you become pregnant. Ask for birth control (contraception) if you want to prevent pregnancy. Osteoporosis and menopause Osteoporosis is a disease in which the bones lose minerals and strength with aging. This can result in bone fractures. If you are 54 years old or older, or if you are at risk for osteoporosis and fractures, ask your health care provider if you should: Be screened for bone loss. Take a calcium or vitamin D supplement to lower your risk of fractures. Be given hormone replacement therapy (HRT) to treat symptoms of menopause. Follow these instructions at home: Alcohol use Do not drink alcohol if: Your health care provider tells you not to drink. You are pregnant, may be pregnant, or are planning to become pregnant. If you drink alcohol: Limit how much you have to: 0-1 drink a day. Know how much alcohol is in your drink. In the [place] one drink equals one 12 oz bottle of beer (355 mL), one 5 oz glass of wine (148 mL), or one 1 oz glass of hard liquor (44 mL). Lifestyle Do not use any products that contain nicotine or tobacco. These products include cigarettes, chewing tobacco, and vaping devices, such as e-cigarettes. If you need help quitting, ask your health care provider. Do not use street drugs. Do not share needles. Ask your health care provider for help if you need support or information about quitting drugs. General instructions Schedule regular health, dental, and eye exams. Stay current with your vaccines. Tell your health care provider if: You often feel depressed. You have ever been abused or do not feel safe at home. Summary Adopting a healthy lifestyle and getting preventive care are important in promoting health and wellness. Follow your health care provider's instructions about healthy diet, exercising, and getting tested or screened for diseases. Follow your health care provider's instructions on monitoring your  cholesterol and blood pressure. This information is not intended to replace advice given to you by your health care provider. Make sure you discuss any questions you have with your health care provider. Document Revised: 04/19/2021 Document Reviewed: 04/19/2021 Elsevier Patient Education  2022 [employer]  Hypertension, Adult Hypertension is another name for high blood pressure. High blood pressure forces your heart to work harder to pump blood. This can cause problems over time. There are two numbers in a blood pressure reading. There is a top number (systolic) over a bottom number (diastolic). It is best to have a blood pressure that is below 120/80. Healthy choices can help lower your blood pressure, or you may need medicine to help lower it. What are the causes? The cause of this condition is not known. Some conditions may be related to high blood pressure. What increases the risk? Smoking. Having type 2 diabetes mellitus, high cholesterol, or both. Not getting enough exercise or physical activity. Being overweight. Having too much fat, sugar, calories, or salt (sodium) in your diet. Drinking too much alcohol. Having long-term (chronic) kidney disease. Having a family history of high blood pressure. Age. Risk increases with age. Race. You may be at higher risk if you are African American. Gender.  Men are at higher risk than women before age 24. After age 61, women are at higher risk than men. Having obstructive sleep apnea. Stress. What are the signs or symptoms? High blood pressure may not cause symptoms. Very high blood pressure (hypertensive crisis) may cause: Headache. Feelings of worry or nervousness (anxiety). Shortness of breath. Nosebleed. A feeling of being sick to your stomach (nausea). Throwing up (vomiting). Changes in how you see. Very bad chest pain. Seizures. How is this treated? This condition is treated by making healthy lifestyle changes, such as: Eating  healthy foods. Exercising more. Drinking less alcohol. Your health care provider may prescribe medicine if lifestyle changes are not enough to get your blood pressure under control, and if: Your top number is above 130. Your bottom number is above 80. Your personal target blood pressure may vary. Follow these instructions at home: Eating and drinking  If told, follow the DASH eating plan. To follow this plan: Fill one half of your plate at each meal with fruits and vegetables. Fill one fourth of your plate at each meal with whole grains. Whole grains include whole-wheat pasta, brown rice, and whole-grain bread. Eat or drink low-fat dairy products, such as skim milk or low-fat yogurt. Fill one fourth of your plate at each meal with low-fat (lean) proteins. Low-fat proteins include fish, chicken without skin, eggs, beans, and tofu. Avoid fatty meat, cured and processed meat, or chicken with skin. Avoid pre-made or processed food. Eat less than 1,500 mg of salt each day. Do not drink alcohol if: Your doctor tells you not to drink. You are pregnant, may be pregnant, or are planning to become pregnant. If you drink alcohol: Limit how much you use to: 0-1 drink a day for women. 0-2 drinks a day for men. Be aware of how much alcohol is in your drink. In the [place] one drink equals one 12 oz bottle of beer (355 mL), one 5 oz glass of wine (148 mL), or one 1 oz glass of hard liquor (44 mL). Lifestyle  Work with your doctor to stay at a healthy weight or to lose weight. Ask your doctor what the best weight is for you. Get at least 30 minutes of exercise most days of the week. This may include walking, swimming, or biking. Get at least 30 minutes of exercise that strengthens your muscles (resistance exercise) at least 3 days a week. This may include lifting weights or doing Pilates. Do not use any products that contain nicotine or tobacco, such as cigarettes, e-cigarettes, and chewing tobacco. If  you need help quitting, ask your doctor. Check your blood pressure at home as told by your doctor. Keep all follow-up visits as told by your doctor. This is important. Medicines Take over-the-counter and prescription medicines only as told by your doctor. Follow directions carefully. Do not skip doses of blood pressure medicine. The medicine does not work as well if you skip doses. Skipping doses also puts you at risk for problems. Ask your doctor about side effects or reactions to medicines that you should watch for. Contact a doctor if you: Think you are having a reaction to the medicine you are taking. Have headaches that keep coming back (recurring). Feel dizzy. Have swelling in your ankles. Have trouble with your vision. Get help right away if you: Get a very bad headache. Start to feel mixed up (confused). Feel weak or numb. Feel faint. Have very bad pain in your: Chest. Belly (abdomen). Throw up more than  once. Have trouble breathing. Summary Hypertension is another name for high blood pressure. High blood pressure forces your heart to work harder to pump blood. For most people, a normal blood pressure is less than 120/80. Making healthy choices can help lower blood pressure. If your blood pressure does not get lower with healthy choices, you may need to take medicine. This information is not intended to replace advice given to you by your health care provider. Make sure you discuss any questions you have with your health care provider. Document Revised: 08/08/2018 Document Reviewed: 08/08/2018 Elsevier Patient Education  2022 [employer]   Low-Sodium Eating Plan Sodium, which is an element that makes up salt, helps you maintain a healthy balance of fluids in your body. Too much sodium can increase your blood pressure and cause fluid and waste to be held in your body. Your health care provider or dietitian may recommend following this plan if you have high blood pressure  (hypertension), kidney disease, liver disease, or heart failure. Eating less sodium can help lower your blood pressure, reduce swelling, and protect your heart, liver, and kidneys. What are tips for following this plan? Reading food labels The Nutrition Facts label lists the amount of sodium in one serving of the food. If you eat more than one serving, you must multiply the listed amount of sodium by the number of servings. Choose foods with less than 140 mg of sodium per serving. Avoid foods with 300 mg of sodium or more per serving. Shopping  Look for lower-sodium products, often labeled as "low-sodium" or "no salt added." Always check the sodium content, even if foods are labeled as "unsalted" or "no salt added." Buy fresh foods. Avoid canned foods and pre-made or frozen meals. Avoid canned, cured, or processed meats. Buy breads that have less than 80 mg of sodium per slice. Cooking  Eat more home-cooked food and less restaurant, buffet, and fast food. Avoid adding salt when cooking. Use salt-free seasonings or herbs instead of table salt or sea salt. Check with your health care provider or pharmacist before using salt substitutes. Cook with plant-based oils, such as canola, sunflower, or olive oil. Meal planning When eating at a restaurant, ask that your food be prepared with less salt or no salt, if possible. Avoid dishes labeled as brined, pickled, cured, smoked, or made with soy sauce, miso, or teriyaki sauce. Avoid foods that contain MSG (monosodium glutamate). MSG is sometimes added to [place]inese food, bouillon, and some canned foods. Make meals that can be grilled, baked, poached, roasted, or steamed. These are generally made with less sodium. General information Most people on this plan should limit their sodium intake to 1,500-2,000 mg (milligrams) of sodium each day. What foods should I eat? Fruits Fresh, frozen, or canned fruit. Fruit juice. Vegetables Fresh or frozen  vegetables. "No salt added" canned vegetables. "No salt added" tomato sauce and paste. Low-sodium or reduced-sodium tomato and vegetable juice. Grains Low-sodium cereals, including oats, puffed wheat and rice, and shredded wheat. Low-sodium crackers. Unsalted rice. Unsalted pasta. Low-sodium bread. Whole-grain breads and whole-grain pasta. Meats and other proteins Fresh or frozen (no salt added) meat, poultry, seafood, and fish. Low-sodium canned tuna and salmon. Unsalted nuts. Dried peas, beans, and lentils without added salt. Unsalted canned beans. Eggs. Unsalted nut butters. Dairy Milk. Soy milk. Cheese that is naturally low in sodium, such as ricotta cheese, fresh mozzarella, or Swiss cheese. Low-sodium or reduced-sodium cheese. Cream cheese. Yogurt. Seasonings and condiments Fresh and dried herbs and  spices. Salt-free seasonings. Low-sodium mustard and ketchup. Sodium-free salad dressing. Sodium-free light mayonnaise. Fresh or refrigerated horseradish. Lemon juice. Vinegar. Other foods Homemade, reduced-sodium, or low-sodium soups. Unsalted popcorn and pretzels. Low-salt or salt-free chips. The items listed above may not be a complete list of foods and beverages you can eat. Contact a dietitian for more information. What foods should I avoid? Vegetables Sauerkraut, pickled vegetables, and relishes. Olives. [place] fries. Onion rings. Regular canned vegetables (not low-sodium or reduced-sodium). Regular canned tomato sauce and paste (not low-sodium or reduced-sodium). Regular tomato and vegetable juice (not low-sodium or reduced-sodium). Frozen vegetables in sauces. Grains Instant hot cereals. Bread stuffing, pancake, and biscuit mixes. Croutons. Seasoned rice or pasta mixes. Noodle soup cups. Boxed or frozen macaroni and cheese. Regular salted crackers. Self-rising flour. Meats and other proteins Meat or fish that is salted, canned, smoked, spiced, or pickled. Precooked or cured meat, such as  sausages or meat loaves. Tomasa Blase. Ham. Pepperoni. Hot dogs. Corned beef. Chipped beef. Salt pork. Jerky. Pickled herring. Anchovies and sardines. Regular canned tuna. Salted nuts. Dairy Processed cheese and cheese spreads. Hard cheeses. Cheese curds. Blue cheese. Feta cheese. String cheese. Regular cottage cheese. Buttermilk. Canned milk. Fats and oils Salted butter. Regular margarine. Ghee. Bacon fat. Seasonings and condiments Onion salt, garlic salt, seasoned salt, table salt, and sea salt. Canned and packaged gravies. Worcestershire sauce. Tartar sauce. Barbecue sauce. Teriyaki sauce. Soy sauce, including reduced-sodium. Steak sauce. Fish sauce. Oyster sauce. Cocktail sauce. Horseradish that you find on the shelf. Regular ketchup and mustard. Meat flavorings and tenderizers. Bouillon cubes. Hot sauce. Pre-made or packaged marinades. Pre-made or packaged taco seasonings. Relishes. Regular salad dressings. Salsa. Other foods Salted popcorn and pretzels. Corn chips and puffs. Potato and tortilla chips. Canned or dried soups. Pizza. Frozen entrees and pot pies. The items listed above may not be a complete list of foods and beverages you should avoid. Contact a dietitian for more information. Summary Eating less sodium can help lower your blood pressure, reduce swelling, and protect your heart, liver, and kidneys. Most people on this plan should limit their sodium intake to 1,500-2,000 mg (milligrams) of sodium each day. Canned, boxed, and frozen foods are high in sodium. Restaurant foods, fast foods, and pizza are also very high in sodium. You also get sodium by adding salt to food. Try to cook at home, eat more fresh fruits and vegetables, and eat less fast food and canned, processed, or prepared foods. This information is not intended to replace advice given to you by your health care provider. Make sure you discuss any questions you have with your health care provider. Document Revised: 01/03/2020  Document Reviewed: 10/30/2019 Elsevier Patient Education  2022 [employer] Inc.   Exercising to Lose Weight Getting regular exercise is important for everyone. It is especially important if you are overweight. Being overweight increases your risk of heart disease, stroke, diabetes, high blood pressure, and several types of cancer. Exercising, and reducing the calories you consume, can help you lose weight and improve fitness and health. Exercise can be moderate or vigorous intensity. To lose weight, most people need to do a certain amount of moderate or vigorous-intensity exercise each week. How can exercise affect me? You lose weight when you exercise enough to burn more calories than you eat. Exercise also reduces body fat and builds muscle. The more muscle you have, the more calories you burn. Exercise also: Improves mood. Reduces stress and tension. Improves your overall fitness, flexibility, and endurance. Increases bone strength.  Moderate-intensity exercise Moderate-intensity exercise is any activity that gets you moving enough to burn at least three times more energy (calories) than if you were sitting. Examples of moderate exercise include: Walking a mile in 15 minutes. Doing light yard work. Biking at an easy pace. Most people should get at least 150 minutes of moderate-intensity exercise a week to maintain their body weight. Vigorous-intensity exercise Vigorous-intensity exercise is any activity that gets you moving enough to burn at least six times more calories than if you were sitting. When you exercise at this intensity, you should be working hard enough that you are not able to carry on a conversation. Examples of vigorous exercise include: Running. Playing a team sport, such as football, basketball, and soccer. Jumping rope. Most people should get at least 75 minutes a week of vigorous exercise to maintain their body weight. What actions can I take to lose weight? The amount  of exercise you need to lose weight depends on: Your age. The type of exercise. Any health conditions you have. Your overall physical ability. Talk to your health care provider about how much exercise you need and what types of activities are safe for you. Nutrition  Make changes to your diet as told by your health care provider or diet and nutrition specialist (dietitian). This may include: Eating fewer calories. Eating more protein. Eating less unhealthy fats. Eating a diet that includes fresh fruits and vegetables, whole grains, low-fat dairy products, and lean protein. Avoiding foods with added fat, salt, and sugar. Drink plenty of water while you exercise to prevent dehydration or heat stroke. Activity Choose an activity that you enjoy and set realistic goals. Your health care provider can help you make an exercise plan that works for you. Exercise at a moderate or vigorous intensity most days of the week. The intensity of exercise may vary from person to person. You can tell how intense a workout is for you by paying attention to your breathing and heartbeat. Most people will notice their breathing and heartbeat get faster with more intense exercise. Do resistance training twice each week, such as: Push-ups. Sit-ups. Lifting weights. Using resistance bands. Getting short amounts of exercise can be just as helpful as long, structured periods of exercise. If you have trouble finding time to exercise, try doing these things as part of your daily routine: Get up, stretch, and walk around every 30 minutes throughout the day. Go for a walk during your lunch break. Park your car farther away from your destination. If you take public transportation, get off one stop early and walk the rest of the way. Make phone calls while standing up and walking around. Take the stairs instead of elevators or escalators. Wear comfortable clothes and shoes with good support. Do not exercise so much  that you hurt yourself, feel dizzy, or get very short of breath. Where to find more information [place] Department of Health and Human Services: ThisPath.fi Centers for Disease Control and Prevention: [employer]Ex[employer].br Contact a health care provider: Before starting a new exercise program. If you have questions or concerns about your weight. If you have a medical problem that keeps you from exercising. Get help right away if: You have any of the following while exercising: Injury. Dizziness. Difficulty breathing or shortness of breath that does not go away when you stop exercising. Chest pain. Rapid heartbeat. These symptoms may represent a serious problem that is an emergency. Do not wait to see if the symptoms will go away. Get medical  help right away. Call your local emergency services (911 in the [place] Do not drive yourself to the hospital. Summary Getting regular exercise is especially important if you are overweight. Being overweight increases your risk of heart disease, stroke, diabetes, high blood pressure, and several types of cancer. Losing weight happens when you burn more calories than you eat. Reducing the amount of calories you eat, and getting regular moderate or vigorous exercise each week, helps you lose weight. This information is not intended to replace advice given to you by your health care provider. Make sure you discuss any questions you have with your health care provider. Document Revised: 01/24/2021 Document Reviewed: 01/24/2021 Elsevier Patient Education  2022 [employer].

## 2022-01-31 NOTE — Progress Notes (Signed)
I,Katawbba Wiggins,acting as a Education administrator for [employer], FNP.,have documented all relevant documentation on the behalf of Minette Brine, FNP,as directed by  Minette Brine, FNP while in the presence of Minette Brine, [place].   This visit occurred during the SARS-CoV-2 public health emergency.  Safety protocols were in place, including screening questions prior to the visit, additional usage of staff PPE, and extensive cleaning of exam room while observing appropriate contact time as indicated for disinfecting solutions.  Subjective:     Patient ID: Christina Houston , female    DOB: 03-20-1966 , 56 y.o.   MRN: [id number]   Chief Complaint  Patient presents with   Annual Exam    HPI  She is here today for a full physical examination. She is followed by Dr. Landry Mellow for her GYN exams.  She was last seen July 2020. She has been seen by virtual sick visit.    Hypertension This is a chronic problem. The current episode started more than 1 year ago. The problem has been gradually improving since onset. The problem is controlled. Pertinent negatives include no blurred vision, chest pain, palpitations or shortness of breath.    Past Medical History:  Diagnosis Date   Hypertension      Family History  Problem Relation Age of Onset   Breast cancer Mother    Breast cancer Cousin    Liver disease Father      Current Outpatient Medications:    olmesartan-hydrochlorothiazide (BENICAR HCT) 40-25 MG tablet, Take 1 tablet by mouth daily., Disp: 90 tablet, Rfl: 1   No Known Allergies    The patient states she uses tubal ligation.   Patient's last menstrual period was 12/12/2021 (approximate)., irregular. Negative for Dysmenorrhea and Negative for Menorrhagia. Negative for: breast discharge, breast lump(s), breast pain and breast self exam. Associated symptoms include abnormal vaginal bleeding. Pertinent negatives include abnormal bleeding (hematology), anxiety, decreased libido, depression,  difficulty falling sleep, dyspareunia, history of infertility, nocturia, sexual dysfunction, sleep disturbances, urinary incontinence, urinary urgency, vaginal discharge and vaginal itching. Diet regular; she does intermittent fasting after 12pm to 7pm, lately has been eating salads and meats. She does occasionally eat more carbs foods during that time frame. The patient states her exercise level is minimal - 30 minutes walking 4-5 times a week.   The patient's tobacco use is:  Social History   Tobacco Use  Smoking Status Never  Smokeless Tobacco Never   She has been exposed to passive smoke. The patient's alcohol use is:  Social History   Substance and Sexual Activity  Alcohol Use No   Additional information: Last pap 07/01/2019, next one scheduled for 05/31/2022.    Review of Systems  Constitutional:  Positive for fatigue.  HENT: Negative.    Eyes: Negative.  Negative for blurred vision.  Respiratory: Negative.  Negative for shortness of breath and wheezing.   Cardiovascular: Negative.  Negative for chest pain, palpitations and leg swelling.  Gastrointestinal: Negative.   Endocrine: Negative.   Genitourinary: Negative.   Musculoskeletal: Negative.   Skin: Negative.   Allergic/Immunologic: Negative.   Neurological: Negative.   Hematological: Negative.   Psychiatric/Behavioral:  Positive for sleep disturbance.     Today's Vitals   01/31/22 1114 01/31/22 1221  BP: (!) 142/90 (!) 140/96  Pulse: 62   Temp: 98 F (36.7 C)   Weight: 220 lb 6.4 oz (100 kg)   Height: 5' 4"  (1.626 m)    Body mass index is 37.83 kg/m.  Wt Readings from  Last 3 Encounters:  01/31/22 220 lb 6.4 oz (100 kg)  08/24/21 211 lb 6.4 oz (95.9 kg)  04/15/21 223 lb 14.4 oz (101.6 kg)    BP Readings from Last 3 Encounters:  01/31/22 (!) 140/96  08/24/21 140/88  04/15/21 118/74    Objective:  Physical Exam Constitutional:      General: She is not in acute distress.    Appearance: Normal appearance.  She is well-developed. She is obese.  HENT:     Head: Normocephalic and atraumatic.     Right Ear: Hearing, tympanic membrane, ear canal and external ear normal. There is no impacted cerumen.     Left Ear: Hearing, tympanic membrane, ear canal and external ear normal. There is no impacted cerumen.     Nose:     Comments: Deferred - masked    Mouth/Throat:     Comments: Deferred - masked Eyes:     General: Lids are normal.     Extraocular Movements: Extraocular movements intact.     Conjunctiva/sclera: Conjunctivae normal.     Pupils: Pupils are equal, round, and reactive to light.     Funduscopic exam:    Right eye: No papilledema.        Left eye: No papilledema.  Neck:     Thyroid: No thyroid mass.     Vascular: No carotid bruit.  Cardiovascular:     Rate and Rhythm: Normal rate and regular rhythm.     Pulses: Normal pulses.     Heart sounds: Normal heart sounds. No murmur heard. Pulmonary:     Effort: Pulmonary effort is normal.     Breath sounds: Normal breath sounds.  Chest:     Chest wall: No mass.  Breasts:    Tanner Score is 5.     Right: Normal. No mass or tenderness.     Left: Normal. No mass or tenderness.  Abdominal:     General: Abdomen is flat. Bowel sounds are normal. There is no distension.     Palpations: Abdomen is soft.     Tenderness: There is no abdominal tenderness.  Musculoskeletal:        General: No swelling. Normal range of motion.     Cervical back: Full passive range of motion without pain, normal range of motion and neck supple.     Right lower leg: No edema.     Left lower leg: No edema.  Lymphadenopathy:     Upper Body:     Right upper body: No supraclavicular, axillary or pectoral adenopathy.     Left upper body: No supraclavicular, axillary or pectoral adenopathy.  Skin:    General: Skin is warm and dry.     Capillary Refill: Capillary refill takes less than 2 seconds.  Neurological:     General: No focal deficit present.     Mental  Status: She is alert and oriented to person, place, and time.     Cranial Nerves: No cranial nerve deficit.     Sensory: No sensory deficit.  Psychiatric:        Mood and Affect: Mood normal.        Behavior: Behavior normal.        Thought Content: Thought content normal.        Judgment: Judgment normal.        Assessment And Plan:     1. Routine general medical examination at a health care facility Behavior modifications discussed and diet history reviewed.   Pt will  continue to exercise regularly and modify diet with low GI, plant based foods and decrease intake of processed foods.  Recommend intake of daily multivitamin, Vitamin D, and calcium.  Recommend mammogram and colonoscopy (both are up to date) for preventive screenings, as well as recommend immunizations that include influenza, TDAP, and Shingles (declined) Declined screening for HIV  2. Encounter for hepatitis C screening test for low risk patient Will check Hepatitis C screening due to recent recommendations to screen all adults 18 years and older - Hepatitis C antibody  3. Varicella zoster virus (VZV) vaccination declined  4. Influenza vaccination declined Patient declined influenza vaccination at this time. Patient is aware that influenza vaccine prevents illness in 70% of healthy people, and reduces hospitalizations to 30-70% in elderly. This vaccine is recommended annually. Pt is willing to accept risk associated with refusing vaccination.  5. Class 2 severe obesity due to excess calories with serious comorbidity and body mass index (BMI) of 37.0 to 37.9 in adult [facility]) Chronic Discussed healthy diet and regular exercise options  Encouraged to exercise at least 150 minutes per week with 2 days of strength training Encouraged to increase physical activity slowly  6. Elevated LDL cholesterol level Slightly elevated LDL at last visit, continue to limit fried and fatty foods.  - Lipid panel  7. Essential  hypertension, benign Comments: Blood pressure is elevated, eGFR was 57 last visit, will recheck to see if improved. Discussed stages of kidney disease. EKG done with Sinus Rhythm HR 53, bradycardia - POCT Urinalysis Dipstick (81002) - Microalbumin / Creatinine Urine Ratio - EKG 12-Lead - CMP14+EGFR - olmesartan-hydrochlorothiazide (BENICAR HCT) 40-25 MG tablet; Take 1 tablet by mouth daily.  Dispense: 90 tablet; Refill: 1  8. Other abnormal glucose Comments: HgbA1c is stable, no current medications - Hemoglobin A1c  9. Other insomnia Comments: Fairly new, realizes she needs to make some changes to sleep pattern.  Encouraged to avoid taking naps during the day. Increase physical activity  10. Other fatigue Comments: Will check for metabolic causes.  - TSH - Vitamin B12 - VITAMIN D 25 Hydroxy (Vit-D Deficiency, Fractures)  11. Other long term (current) drug therapy - CBC She is encouraged to strive for BMI less than 30 to decrease cardiac risk. Advised to aim for at least 150 minutes of exercise per week.     Patient was given opportunity to ask questions. Patient verbalized understanding of the plan and was able to repeat key elements of the plan. All questions were answered to their satisfaction.   Minette Brine, FNP   I, Minette Brine, FNP, have reviewed all documentation for this visit. The documentation on 01/31/22 for the exam, diagnosis, procedures, and orders are all accurate and complete.  THE PATIENT IS ENCOURAGED TO PRACTICE SOCIAL DISTANCING DUE TO THE COVID-19 PANDEMIC.

## 2022-02-01 LAB — CMP14+EGFR
ALT: 10 IU/L (ref 0–32)
AST: 11 IU/L (ref 0–40)
Albumin/Globulin Ratio: 1.3 (ref 1.2–2.2)
Albumin: 4.4 g/dL (ref 3.8–4.9)
Alkaline Phosphatase: 83 IU/L (ref 44–121)
BUN/Creatinine Ratio: 13 (ref 9–23)
BUN: 13 mg/dL (ref 6–24)
Bilirubin Total: 0.6 mg/dL (ref 0.0–1.2)
CO2: 22 mmol/L (ref 20–29)
Calcium: 9.6 mg/dL (ref 8.7–10.2)
Chloride: 100 mmol/L (ref 96–106)
Creatinine, Ser: 0.97 mg/dL (ref 0.57–1.00)
Globulin, Total: 3.5 g/dL (ref 1.5–4.5)
Glucose: 82 mg/dL (ref 70–99)
Potassium: 4.2 mmol/L (ref 3.5–5.2)
Sodium: 137 mmol/L (ref 134–144)
Total Protein: 7.9 g/dL (ref 6.0–8.5)
eGFR: 70 mL/min/{1.73_m2} (ref >59)

## 2022-02-01 LAB — LIPID PANEL
Chol/HDL Ratio: 2.2 ratio (ref 0.0–4.4)
Cholesterol, Total: 224 mg/dL — ABNORMAL HIGH (ref 100–199)
HDL: 102 mg/dL (ref >39)
LDL Chol Calc (NIH): 112 mg/dL — ABNORMAL HIGH (ref 0–99)
Triglycerides: 57 mg/dL (ref 0–149)
VLDL Cholesterol Cal: 10 mg/dL (ref 5–40)

## 2022-02-01 LAB — CBC
Hematocrit: 36.8 % (ref 34.0–46.6)
Hemoglobin: 11.7 g/dL (ref 11.1–15.9)
MCH: 25.5 pg — ABNORMAL LOW (ref 26.6–33.0)
MCHC: 31.8 g/dL (ref 31.5–35.7)
MCV: 80 fL (ref 79–97)
Platelets: 340 10*3/uL (ref 150–450)
RBC: 4.59 x10E6/uL (ref 3.77–5.28)
RDW: 16.9 % — ABNORMAL HIGH (ref 11.7–15.4)
WBC: 4 10*3/uL (ref 3.4–10.8)

## 2022-02-01 LAB — MICROALBUMIN / CREATININE URINE RATIO
Creatinine, Urine: 26 mg/dL
Microalb/Creat Ratio: <12 mg/g creat (ref 0–29)
Microalbumin, Urine: <3 ug/mL

## 2022-02-01 LAB — HEMOGLOBIN A1C
Est. average glucose Bld gHb Est-mCnc: 128 mg/dL
Hgb A1c MFr Bld: 6.1 % — ABNORMAL HIGH (ref 4.8–5.6)

## 2022-02-01 LAB — VITAMIN D 25 HYDROXY (VIT D DEFICIENCY, FRACTURES): Vit D, 25-Hydroxy: 35.1 ng/mL (ref 30.0–100.0)

## 2022-02-01 LAB — HEPATITIS C ANTIBODY: Hep C Virus Ab: NONREACTIVE

## 2022-02-01 LAB — VITAMIN B12: Vitamin B-12: 269 pg/mL (ref 232–1245)

## 2022-02-01 LAB — TSH: TSH: 1.88 u[IU]/mL (ref 0.450–4.500)

## 2022-05-02 ENCOUNTER — Encounter: Payer: Self-pay | Admitting: Nurse Practitioner

## 2022-05-02 ENCOUNTER — Ambulatory Visit: Payer: BC Managed Care – PPO | Admitting: Nurse Practitioner

## 2022-05-02 VITALS — BP 124/80 | HR 74 | Temp 98.0°F | Ht 64.0 in | Wt 218.0 lb

## 2022-05-02 DIAGNOSIS — R21 Rash and other nonspecific skin eruption: Secondary | ICD-10-CM

## 2022-05-02 DIAGNOSIS — R7309 Other abnormal glucose: Secondary | ICD-10-CM | POA: Diagnosis not present

## 2022-05-02 DIAGNOSIS — Z6837 Body mass index (BMI) 37.0-37.9, adult: Secondary | ICD-10-CM

## 2022-05-02 DIAGNOSIS — I10 Essential (primary) hypertension: Secondary | ICD-10-CM

## 2022-05-02 MED ORDER — OLMESARTAN MEDOXOMIL-HCTZ 40-25 MG PO TABS: 1.0000 | ORAL_TABLET | Freq: Every day | ORAL | 1 refills | Status: DC

## 2022-05-02 NOTE — Patient Instructions (Signed)
Hypertension, Adult High blood pressure (hypertension) is when the force of blood pumping through the arteries is too strong. The arteries are the blood vessels that carry blood from the heart throughout the body. Hypertension forces the heart to work harder to pump blood and may cause arteries to become narrow or stiff. Untreated or uncontrolled hypertension can lead to a heart attack, heart failure, a stroke, kidney disease, and other problems. A blood pressure reading consists of a higher number over a lower number. Ideally, your blood pressure should be below 120/80. The first ("top") number is called the systolic pressure. It is a measure of the pressure in your arteries as your heart beats. The second ("bottom") number is called the diastolic pressure. It is a measure of the pressure in your arteries as the heart relaxes. What are the causes? The exact cause of this condition is not known. There are some conditions that result in high blood pressure. What increases the risk? Certain factors may make you more likely to develop high blood pressure. Some of these risk factors are under your control, including: Smoking. Not getting enough exercise or physical activity. Being overweight. Having too much fat, sugar, calories, or salt (sodium) in your diet. Drinking too much alcohol. Other risk factors include: Having a personal history of heart disease, diabetes, high cholesterol, or kidney disease. Stress. Having a family history of high blood pressure and high cholesterol. Having obstructive sleep apnea. Age. The risk increases with age. What are the signs or symptoms? High blood pressure may not cause symptoms. Very high blood pressure (hypertensive crisis) may cause: Headache. Fast or irregular heartbeats (palpitations). Shortness of breath. Nosebleed. Nausea and vomiting. Vision changes. Severe chest pain, dizziness, and seizures. How is this diagnosed? This condition is diagnosed by  measuring your blood pressure while you are seated, with your arm resting on a flat surface, your legs uncrossed, and your feet flat on the floor. The cuff of the blood pressure monitor will be placed directly against the skin of your upper arm at the level of your heart. Blood pressure should be measured at least twice using the same arm. Certain conditions can cause a difference in blood pressure between your right and left arms. If you have a high blood pressure reading during one visit or you have normal blood pressure with other risk factors, you may be asked to: Return on a different day to have your blood pressure checked again. Monitor your blood pressure at home for 1 week or longer. If you are diagnosed with hypertension, you may have other blood or imaging tests to help your health care provider understand your overall risk for other conditions. How is this treated? This condition is treated by making healthy lifestyle changes, such as eating healthy foods, exercising more, and reducing your alcohol intake. You may be referred for counseling on a healthy diet and physical activity. Your health care provider may prescribe medicine if lifestyle changes are not enough to get your blood pressure under control and if: Your systolic blood pressure is above 130. Your diastolic blood pressure is above 80. Your personal target blood pressure may vary depending on your medical conditions, your age, and other factors. Follow these instructions at home: Eating and drinking  Eat a diet that is high in fiber and potassium, and low in sodium, added sugar, and fat. An example of this eating plan is called the DASH diet. DASH stands for Dietary Approaches to Stop Hypertension. To eat this way: Eat   plenty of fresh fruits and vegetables. Try to fill one half of your plate at each meal with fruits and vegetables. Eat whole grains, such as whole-wheat pasta, brown rice, or whole-grain bread. Fill about one  fourth of your plate with whole grains. Eat or drink low-fat dairy products, such as skim milk or low-fat yogurt. Avoid fatty cuts of meat, processed or cured meats, and poultry with skin. Fill about one fourth of your plate with lean proteins, such as fish, chicken without skin, beans, eggs, or tofu. Avoid pre-made and processed foods. These tend to be higher in sodium, added sugar, and fat. Reduce your daily sodium intake. Many people with hypertension should eat less than 1,500 mg of sodium a day. Do not drink alcohol if: Your health care provider tells you not to drink. You are pregnant, may be pregnant, or are planning to become pregnant. If you drink alcohol: Limit how much you have to: 0-1 drink a day for women. 0-2 drinks a day for men. Know how much alcohol is in your drink. In the [place] one drink equals one 12 oz bottle of beer (355 mL), one 5 oz glass of wine (148 mL), or one 1 oz glass of hard liquor (44 mL). Lifestyle  Work with your health care provider to maintain a healthy body weight or to lose weight. Ask what an ideal weight is for you. Get at least 30 minutes of exercise that causes your heart to beat faster (aerobic exercise) most days of the week. Activities may include walking, swimming, or biking. Include exercise to strengthen your muscles (resistance exercise), such as Pilates or lifting weights, as part of your weekly exercise routine. Try to do these types of exercises for 30 minutes at least 3 days a week. Do not use any products that contain nicotine or tobacco. These products include cigarettes, chewing tobacco, and vaping devices, such as e-cigarettes. If you need help quitting, ask your health care provider. Monitor your blood pressure at home as told by your health care provider. Keep all follow-up visits. This is important. Medicines Take over-the-counter and prescription medicines only as told by your health care provider. Follow directions carefully. Blood  pressure medicines must be taken as prescribed. Do not skip doses of blood pressure medicine. Doing this puts you at risk for problems and can make the medicine less effective. Ask your health care provider about side effects or reactions to medicines that you should watch for. Contact a health care provider if you: Think you are having a reaction to a medicine you are taking. Have headaches that keep coming back (recurring). Feel dizzy. Have swelling in your ankles. Have trouble with your vision. Get help right away if you: Develop a severe headache or confusion. Have unusual weakness or numbness. Feel faint. Have severe pain in your chest or abdomen. Vomit repeatedly. Have trouble breathing. These symptoms may be an emergency. Get help right away. Call 911. Do not wait to see if the symptoms will go away. Do not drive yourself to the hospital. Summary Hypertension is when the force of blood pumping through your arteries is too strong. If this condition is not controlled, it may put you at risk for serious complications. Your personal target blood pressure may vary depending on your medical conditions, your age, and other factors. For most people, a normal blood pressure is less than 120/80. Hypertension is treated with lifestyle changes, medicines, or a combination of both. Lifestyle changes include losing weight, eating a healthy,   low-sodium diet, exercising more, and limiting alcohol. This information is not intended to replace advice given to you by your health care provider. Make sure you discuss any questions you have with your health care provider. Document Revised: 10/05/2021 Document Reviewed: 10/05/2021 Elsevier Patient Education  2023 Elsevier Inc.  

## 2022-05-02 NOTE — Progress Notes (Signed)
I,Tianna Badgett,acting as a Education administrator for [employer], FNP.,have documented all relevant documentation on the behalf of Minette Brine, FNP,as directed by  Minette Brine, FNP while in the presence of Minette Brine, [place].  This visit occurred during the SARS-CoV-2 public health emergency.  Safety protocols were in place, including screening questions prior to the visit, additional usage of staff PPE, and extensive cleaning of exam room while observing appropriate contact time as indicated for disinfecting solutions.  Subjective:     Patient ID: Christina Houston , female    DOB: 1966/06/04 , 56 y.o.   MRN: [id number]   Chief Complaint  Patient presents with   Hypertension    HPI  Pt presents today for BP follow up.   BP Readings from Last 3 Encounters: 05/02/22 : 124/80 01/31/22 : (!) 140/96 08/24/21 : 140/88   Wt Readings from Last 3 Encounters: 05/02/22 : 218 lb (98.9 kg) 01/31/22 : 220 lb 6.4 oz (100 kg) 08/24/21 : 211 lb 6.4 oz (95.9 kg)  LMP - April 2023, she is followed by Dr. Christophe Louis  Reports her and her husband both have a rash in the same areas. She is feeling an "itch". She has been using a  She did use a new detergent. She also has "bumps" between thighs.     Hypertension This is a chronic problem. The current episode started more than 1 year ago. The problem is uncontrolled. Pertinent negatives include no anxiety, chest pain, headaches or palpitations. Risk factors for coronary artery disease include obesity and sedentary lifestyle. Past treatments include ACE inhibitors and diuretics. There are no compliance problems.  There is no history of angina. There is no history of chronic renal disease.    Past Medical History:  Diagnosis Date   Hypertension      Family History  Problem Relation Age of Onset   Breast cancer Mother    Breast cancer Cousin    Liver disease Father      Current Outpatient Medications:    Ascorbic Acid (VITAMIN C) 100 MG CHEW,  1 tablet, Disp: , Rfl:    Calcium Carb-Cholecalciferol (CALCIUM 1000 + D) 1000-20 MG-MCG TABS, 1 tab, Disp: , Rfl:    Iron-Vitamin C (IRON 100/C) 100-250 MG TABS, 1 tablet, Disp: , Rfl:    olmesartan-hydrochlorothiazide (BENICAR HCT) 40-25 MG tablet, Take 1 tablet by mouth daily., Disp: 90 tablet, Rfl: 1   Vitamin D, Ergocalciferol, 50 MCG (2000 UT) CAPS, 1 capsule, Disp: , Rfl:    No Known Allergies   Review of Systems  Constitutional: Negative.   Respiratory: Negative.    Cardiovascular: Negative.  Negative for chest pain and palpitations.  Gastrointestinal: Negative.   Skin:  Positive for rash (mid chest and upper buttocks).  Neurological: Negative.  Negative for headaches.  Psychiatric/Behavioral: Negative.      Today's Vitals   05/02/22 1616  BP: 124/80  Pulse: 74  Temp: 98 F (36.7 C)  TempSrc: Oral  Weight: 218 lb (98.9 kg)  Height: 5' 4" (1.626 m)   Body mass index is 37.42 kg/m.   Objective:  Physical Exam Vitals reviewed.  Constitutional:      General: She is not in acute distress.    Appearance: Normal appearance.  Cardiovascular:     Rate and Rhythm: Normal rate and regular rhythm.     Pulses: Normal pulses.     Heart sounds: Normal heart sounds. No murmur heard. Pulmonary:     Effort: Pulmonary effort is normal. No  respiratory distress.     Breath sounds: Normal breath sounds. No wheezing.  Skin:    General: Skin is warm and dry.     Findings: Rash (she has one erythematous vesicle to the middle of her chest. she has a few scabbed vesicles to her low back.  I do not seen any vesicles to her vaginal area) present.  Neurological:     General: No focal deficit present.     Mental Status: She is alert and oriented to person, place, and time.     Cranial Nerves: No cranial nerve deficit.  Psychiatric:        Mood and Affect: Mood normal.        Behavior: Behavior normal.        Thought Content: Thought content normal.        Judgment: Judgment normal.         Assessment And Plan:     1. Essential hypertension, benign Comments: Blood pressure is elevated, eGFR was 57 last visit, will recheck to see if improved. Discussed stages of kidney disease. - olmesartan-hydrochlorothiazide (BENICAR HCT) 40-25 MG tablet; Take 1 tablet by mouth daily.  Dispense: 90 tablet; Refill: 1  2. Abnormal glucose Comments: HgbA1c slightly up at last visit. Encouraged to eat a healthy diet low in carbs and sugar - Hemoglobin A1c  3. Rash and nonspecific skin eruption Comments: Will check HSV 1/2, also encouraged to have her house checked for bed bugs since both her husband and her have a rash in similar areas. Take antihistamine - HSV(herpes simplex vrs) 1+2 ab-IgG  4. Class 2 severe obesity due to excess calories with serious comorbidity and body mass index (BMI) of 37.0 to 37.9 in adult [facility])     Patient was given opportunity to ask questions. Patient verbalized understanding of the plan and was able to repeat key elements of the plan. All questions were answered to their satisfaction.  Minette Brine, FNP   I, Minette Brine, FNP, have reviewed all documentation for this visit. The documentation on 05/02/22 for the exam, diagnosis, procedures, and orders are all accurate and complete.   IF YOU HAVE BEEN REFERRED TO A SPECIALIST, IT MAY TAKE 1-2 WEEKS TO SCHEDULE/PROCESS THE REFERRAL. IF YOU HAVE NOT HEARD FROM US/SPECIALIST IN TWO WEEKS, PLEASE GIVE [place] A CALL AT [phone].   THE PATIENT IS ENCOURAGED TO PRACTICE SOCIAL DISTANCING DUE TO THE COVID-19 PANDEMIC.

## 2022-05-03 ENCOUNTER — Encounter: Payer: Self-pay | Admitting: Nurse Practitioner

## 2022-05-03 LAB — HSV(HERPES SIMPLEX VRS) I + II AB-IGG
HSV 1 Glycoprotein G Ab, IgG: 20.9 index — ABNORMAL HIGH (ref 0.00–0.90)
HSV 2 IgG, Type Spec: <0.91 index (ref 0.00–0.90)

## 2022-05-03 LAB — HEMOGLOBIN A1C
Est. average glucose Bld gHb Est-mCnc: 123 mg/dL
Hgb A1c MFr Bld: 5.9 % — ABNORMAL HIGH (ref 4.8–5.6)

## 2022-05-17 NOTE — Telephone Encounter (Signed)
Returned call to patient to discuss HSV 1, she does not have any symptoms at this time and the rash she had previously is resolved.

## 2022-05-26 ENCOUNTER — Ambulatory Visit
Admission: RE | Admit: 2022-05-26 | Discharge: 2022-05-26 | Disposition: A | Discharge status: Home / Self Care | Payer: BC Managed Care – PPO | Source: Ambulatory Visit | Attending: Internal Medicine | Admitting: Internal Medicine

## 2022-05-26 DIAGNOSIS — Z1231 Encounter for screening mammogram for malignant neoplasm of breast: Secondary | ICD-10-CM

## 2022-05-27 ENCOUNTER — Other Ambulatory Visit: Payer: Self-pay | Admitting: Obstetrics and Gynecology

## 2022-05-27 ENCOUNTER — Other Ambulatory Visit (HOSPITAL_COMMUNITY)
Admission: RE | Admit: 2022-05-27 | Discharge: 2022-05-27 | Disposition: A | Discharge status: Home / Self Care | Payer: BC Managed Care – PPO | Source: Ambulatory Visit | Attending: Obstetrics and Gynecology | Admitting: Obstetrics and Gynecology

## 2022-05-27 DIAGNOSIS — Z01419 Encounter for gynecological examination (general) (routine) without abnormal findings: Secondary | ICD-10-CM | POA: Insufficient documentation

## 2022-05-30 ENCOUNTER — Other Ambulatory Visit: Payer: Self-pay | Admitting: Internal Medicine

## 2022-05-30 DIAGNOSIS — R928 Other abnormal and inconclusive findings on diagnostic imaging of breast: Secondary | ICD-10-CM

## 2022-06-02 ENCOUNTER — Ambulatory Visit
Admission: RE | Admit: 2022-06-02 | Discharge: 2022-06-02 | Disposition: A | Discharge status: Home / Self Care | Payer: BC Managed Care – PPO | Source: Ambulatory Visit | Attending: Internal Medicine | Admitting: Internal Medicine

## 2022-06-02 ENCOUNTER — Other Ambulatory Visit: Payer: Self-pay | Admitting: Internal Medicine

## 2022-06-02 ENCOUNTER — Other Ambulatory Visit (HOSPITAL_COMMUNITY)
Admission: RE | Admit: 2022-06-02 | Discharge: 2022-06-02 | Disposition: A | Discharge status: Home / Self Care | Payer: BC Managed Care – PPO | Source: Ambulatory Visit | Attending: Radiology | Admitting: Radiology

## 2022-06-02 DIAGNOSIS — R599 Enlarged lymph nodes, unspecified: Secondary | ICD-10-CM

## 2022-06-02 DIAGNOSIS — R928 Other abnormal and inconclusive findings on diagnostic imaging of breast: Secondary | ICD-10-CM

## 2022-06-02 LAB — CYTOLOGY - PAP
Comment: NEGATIVE
Diagnosis: NEGATIVE
Diagnosis: REACTIVE
High risk HPV: NEGATIVE

## 2022-06-03 LAB — SURGICAL PATHOLOGY

## 2022-06-08 ENCOUNTER — Other Ambulatory Visit: Payer: Self-pay | Admitting: Obstetrics and Gynecology

## 2022-08-29 ENCOUNTER — Encounter: Payer: Self-pay | Admitting: Nurse Practitioner

## 2022-09-05 ENCOUNTER — Telehealth (INDEPENDENT_AMBULATORY_CARE_PROVIDER_SITE_OTHER): Payer: BC Managed Care – PPO | Admitting: Nurse Practitioner

## 2022-09-05 ENCOUNTER — Encounter: Payer: Self-pay | Admitting: Nurse Practitioner

## 2022-09-05 DIAGNOSIS — Z8616 Personal history of COVID-19: Secondary | ICD-10-CM | POA: Diagnosis not present

## 2022-09-05 DIAGNOSIS — R7309 Other abnormal glucose: Secondary | ICD-10-CM

## 2022-09-05 DIAGNOSIS — E7800 Pure hypercholesterolemia, unspecified: Secondary | ICD-10-CM | POA: Diagnosis not present

## 2022-09-05 DIAGNOSIS — I10 Essential (primary) hypertension: Secondary | ICD-10-CM

## 2022-09-05 MED ORDER — OLMESARTAN MEDOXOMIL-HCTZ 40-25 MG PO TABS: 1.0000 | ORAL_TABLET | Freq: Every day | ORAL | 1 refills | Status: DC

## 2022-09-05 NOTE — Patient Instructions (Signed)
Hypertension, Adult High blood pressure (hypertension) is when the force of blood pumping through the arteries is too strong. The arteries are the blood vessels that carry blood from the heart throughout the body. Hypertension forces the heart to work harder to pump blood and may cause arteries to become narrow or stiff. Untreated or uncontrolled hypertension can lead to a heart attack, heart failure, a stroke, kidney disease, and other problems. A blood pressure reading consists of a higher number over a lower number. Ideally, your blood pressure should be below 120/80. The first ("top") number is called the systolic pressure. It is a measure of the pressure in your arteries as your heart beats. The second ("bottom") number is called the diastolic pressure. It is a measure of the pressure in your arteries as the heart relaxes. What are the causes? The exact cause of this condition is not known. There are some conditions that result in high blood pressure. What increases the risk? Certain factors may make you more likely to develop high blood pressure. Some of these risk factors are under your control, including: Smoking. Not getting enough exercise or physical activity. Being overweight. Having too much fat, sugar, calories, or salt (sodium) in your diet. Drinking too much alcohol. Other risk factors include: Having a personal history of heart disease, diabetes, high cholesterol, or kidney disease. Stress. Having a family history of high blood pressure and high cholesterol. Having obstructive sleep apnea. Age. The risk increases with age. What are the signs or symptoms? High blood pressure may not cause symptoms. Very high blood pressure (hypertensive crisis) may cause: Headache. Fast or irregular heartbeats (palpitations). Shortness of breath. Nosebleed. Nausea and vomiting. Vision changes. Severe chest pain, dizziness, and seizures. How is this diagnosed? This condition is diagnosed by  measuring your blood pressure while you are seated, with your arm resting on a flat surface, your legs uncrossed, and your feet flat on the floor. The cuff of the blood pressure monitor will be placed directly against the skin of your upper arm at the level of your heart. Blood pressure should be measured at least twice using the same arm. Certain conditions can cause a difference in blood pressure between your right and left arms. If you have a high blood pressure reading during one visit or you have normal blood pressure with other risk factors, you may be asked to: Return on a different day to have your blood pressure checked again. Monitor your blood pressure at home for 1 week or longer. If you are diagnosed with hypertension, you may have other blood or imaging tests to help your health care provider understand your overall risk for other conditions. How is this treated? This condition is treated by making healthy lifestyle changes, such as eating healthy foods, exercising more, and reducing your alcohol intake. You may be referred for counseling on a healthy diet and physical activity. Your health care provider may prescribe medicine if lifestyle changes are not enough to get your blood pressure under control and if: Your systolic blood pressure is above 130. Your diastolic blood pressure is above 80. Your personal target blood pressure may vary depending on your medical conditions, your age, and other factors. Follow these instructions at home: Eating and drinking  Eat a diet that is high in fiber and potassium, and low in sodium, added sugar, and fat. An example of this eating plan is called the DASH diet. DASH stands for Dietary Approaches to Stop Hypertension. To eat this way: Eat   plenty of fresh fruits and vegetables. Try to fill one half of your plate at each meal with fruits and vegetables. Eat whole grains, such as whole-wheat pasta, brown rice, or whole-grain bread. Fill about one  fourth of your plate with whole grains. Eat or drink low-fat dairy products, such as skim milk or low-fat yogurt. Avoid fatty cuts of meat, processed or cured meats, and poultry with skin. Fill about one fourth of your plate with lean proteins, such as fish, chicken without skin, beans, eggs, or tofu. Avoid pre-made and processed foods. These tend to be higher in sodium, added sugar, and fat. Reduce your daily sodium intake. Many people with hypertension should eat less than 1,500 mg of sodium a day. Do not drink alcohol if: Your health care provider tells you not to drink. You are pregnant, may be pregnant, or are planning to become pregnant. If you drink alcohol: Limit how much you have to: 0-1 drink a day for women. 0-2 drinks a day for men. Know how much alcohol is in your drink. In the [place] one drink equals one 12 oz bottle of beer (355 mL), one 5 oz glass of wine (148 mL), or one 1 oz glass of hard liquor (44 mL). Lifestyle  Work with your health care provider to maintain a healthy body weight or to lose weight. Ask what an ideal weight is for you. Get at least 30 minutes of exercise that causes your heart to beat faster (aerobic exercise) most days of the week. Activities may include walking, swimming, or biking. Include exercise to strengthen your muscles (resistance exercise), such as Pilates or lifting weights, as part of your weekly exercise routine. Try to do these types of exercises for 30 minutes at least 3 days a week. Do not use any products that contain nicotine or tobacco. These products include cigarettes, chewing tobacco, and vaping devices, such as e-cigarettes. If you need help quitting, ask your health care provider. Monitor your blood pressure at home as told by your health care provider. Keep all follow-up visits. This is important. Medicines Take over-the-counter and prescription medicines only as told by your health care provider. Follow directions carefully. Blood  pressure medicines must be taken as prescribed. Do not skip doses of blood pressure medicine. Doing this puts you at risk for problems and can make the medicine less effective. Ask your health care provider about side effects or reactions to medicines that you should watch for. Contact a health care provider if you: Think you are having a reaction to a medicine you are taking. Have headaches that keep coming back (recurring). Feel dizzy. Have swelling in your ankles. Have trouble with your vision. Get help right away if you: Develop a severe headache or confusion. Have unusual weakness or numbness. Feel faint. Have severe pain in your chest or abdomen. Vomit repeatedly. Have trouble breathing. These symptoms may be an emergency. Get help right away. Call 911. Do not wait to see if the symptoms will go away. Do not drive yourself to the hospital. Summary Hypertension is when the force of blood pumping through your arteries is too strong. If this condition is not controlled, it may put you at risk for serious complications. Your personal target blood pressure may vary depending on your medical conditions, your age, and other factors. For most people, a normal blood pressure is less than 120/80. Hypertension is treated with lifestyle changes, medicines, or a combination of both. Lifestyle changes include losing weight, eating a healthy,   low-sodium diet, exercising more, and limiting alcohol. This information is not intended to replace advice given to you by your health care provider. Make sure you discuss any questions you have with your health care provider. Document Revised: 10/05/2021 Document Reviewed: 10/05/2021 Elsevier Patient Education  2023 Elsevier Inc.  

## 2022-09-05 NOTE — Progress Notes (Signed)
Virtual Visit via MyChart   This visit type was conducted due to national recommendations for restrictions regarding the COVID-19 Pandemic (e.g. social distancing) in an effort to limit this patient's exposure and mitigate transmission in our community.  Due to her co-morbid illnesses, this patient is at least at moderate risk for complications without adequate follow up.  This format is felt to be most appropriate for this patient at this time.  All issues noted in this document were discussed and addressed.  A limited physical exam was performed with this format.    This visit type was conducted due to national recommendations for restrictions regarding the COVID-19 Pandemic (e.g. social distancing) in an effort to limit this patient's exposure and mitigate transmission in our community.  Patients identity confirmed using two different identifiers.  This format is felt to be most appropriate for this patient at this time.  All issues noted in this document were discussed and addressed.  No physical exam was performed (except for noted visual exam findings with Video Visits).    Date:  09/09/2022   ID:  Christina Houston, DOB [birth year], MRN [id number]  Patient Location:  Home - spoke with Christina Houston  Provider location:   Office    Chief Complaint:  Blood pressure f/u and recent covid  History of Present Illness:    Christina Houston is a 56 y.o. female who presents via video conferencing for a telehealth visit today.    The patient does have symptoms concerning for COVID-19 infection (fever, chills, cough, or new shortness of breath).   Pt presents today for BP follow up.  She is now over covid  127/84  124/87 121/76  She is trying to rest more. Has not been out of the house since being sick. She has taken Ibuprofen for her symptoms and also takes with her menstrual cycle. After having heavy bleeding in May and June and recommends hysterectomy but  she does not want to have that done. She goes to [facility] Louis MD for her GYN care.   Hyperlipidemia This is a chronic problem. The current episode started more than 1 year ago. The problem is controlled. She has no history of chronic renal disease. Pertinent negatives include no chest pain. Compliance problems include psychosocial issues.  Risk factors for coronary artery disease include obesity and a sedentary lifestyle.  Hypertension This is a chronic problem. The current episode started more than 1 year ago. The problem is uncontrolled. Pertinent negatives include no anxiety, chest pain, headaches or palpitations. Risk factors for coronary artery disease include obesity and sedentary lifestyle. Past treatments include ACE inhibitors and diuretics. There are no compliance problems.  There is no history of angina. There is no history of chronic renal disease.     Past Medical History:  Diagnosis Date   Hypertension    History reviewed. No pertinent surgical history.   No outpatient medications have been marked as taking for the 09/05/22 encounter (Video Visit) with Minette Brine, [place].     Allergies:   Patient has no known allergies.   Social History   Tobacco Use   Smoking status: Never   Smokeless tobacco: Never  Vaping Use   Vaping Use: Never used  Substance Use Topics   Alcohol use: No   Drug use: Never     Family Hx: The patient's family history includes Breast cancer in her cousin and mother; Liver disease in her father.  ROS:   Please see the history of  present illness.    Review of Systems  Constitutional: Negative.   Respiratory: Negative.    Cardiovascular: Negative.  Negative for chest pain and palpitations.  Gastrointestinal: Negative.   Neurological: Negative.  Negative for headaches.  Psychiatric/Behavioral: Negative.      All other systems reviewed and are negative.   Labs/Other Tests and Data Reviewed:    Recent Labs: 01/31/2022: ALT 10; BUN 13;  Creatinine, Ser 0.97; Hemoglobin 11.7; Platelets 340; Potassium 4.2; Sodium 137; TSH 1.880   Recent Lipid Panel Lab Results  Component Value Date/Time   CHOL 224 (H) 01/31/2022 12:32 PM   TRIG 57 01/31/2022 12:32 PM   HDL 102 01/31/2022 12:32 PM   CHOLHDL 2.2 01/31/2022 12:32 PM   LDLCALC 112 (H) 01/31/2022 12:32 PM    Wt Readings from Last 3 Encounters:  05/02/22 218 lb (98.9 kg)  01/31/22 220 lb 6.4 oz (100 kg)  08/24/21 211 lb 6.4 oz (95.9 kg)     Exam:    Vital Signs:  There were no vitals taken for this visit.    Physical Exam Constitutional:      General: She is not in acute distress.    Appearance: Normal appearance.  Pulmonary:     Effort: Pulmonary effort is normal. No respiratory distress.  Neurological:     General: No focal deficit present.     Mental Status: She is alert and oriented to person, place, and time. Mental status is at baseline.     Cranial Nerves: No cranial nerve deficit.  Psychiatric:        Mood and Affect: Mood and affect normal.        Behavior: Behavior normal.        Thought Content: Thought content normal.        Cognition and Memory: Memory normal.        Judgment: Judgment normal.     ASSESSMENT & PLAN:    1. Essential hypertension, benign Continue current medications, she will come in 2 weeks for her labs - olmesartan-hydrochlorothiazide (BENICAR HCT) 40-25 MG tablet; Take 1 tablet by mouth daily.  Dispense: 90 tablet; Refill: 1 - BMP8+eGFR  2. Abnormal glucose Stable, encouraged to continue exercising regularly and healthy diet  3. Elevated LDL cholesterol level Stable, continue low fat diet - Lipid panel  4. History of COVID-19 She is on her last day of quarantine, doing well. Advised to take vitamin c, d and zinc. Advised to wait 90 days from symptoms before getting covid vaccine     COVID-19 Education: The signs and symptoms of COVID-19 were discussed with the patient and how to seek care for testing (follow up  with PCP or arrange E-visit).  The importance of social distancing was discussed today.  Patient Risk:   After full review of this patients clinical status, I feel that they are at least moderate risk at this time.  Time:   Today, I have spent 9 minutes/ seconds with the patient with telehealth technology discussing above diagnoses.     Medication Adjustments/Labs and Tests Ordered: Current medicines are reviewed at length with the patient today.  Concerns regarding medicines are outlined above.   Tests Ordered: Orders Placed This Encounter  Procedures   Lipid panel   BMP8+eGFR   Hemoglobin A1c    Medication Changes: Meds ordered this encounter  Medications   olmesartan-hydrochlorothiazide (BENICAR HCT) 40-25 MG tablet    Sig: Take 1 tablet by mouth daily.    Dispense:  90 tablet  Refill:  1    Requesting 1 year supply    Disposition:  Follow up in 3 month(s)  Signed, Minette Brine, FNP

## 2022-09-20 ENCOUNTER — Other Ambulatory Visit: Payer: BC Managed Care – PPO

## 2022-09-20 DIAGNOSIS — R7309 Other abnormal glucose: Secondary | ICD-10-CM

## 2022-09-20 LAB — HEMOGLOBIN A1C
Est. average glucose Bld gHb Est-mCnc: 134 mg/dL
Hgb A1c MFr Bld: 6.3 % — ABNORMAL HIGH (ref 4.8–5.6)

## 2022-11-21 IMAGING — MG MM DIGITAL DIAGNOSTIC UNILAT*R* W/ TOMO W/ CAD
6 series · 6 of 18 positions shown · non-contrast
Comparison: Previous exam(s).

CLINICAL DATA: Possible asymmetry in the upper outer right breast
recent screening mammogram. The patient reports an episode of
diffuse hives all over her body in April 2022 with no known cause
found.

EXAM:
DIGITAL DIAGNOSTIC UNILATERAL RIGHT MAMMOGRAM WITH TOMOSYNTHESIS AND
CAD; ULTRASOUND RIGHT BREAST LIMITED
TECHNIQUE: Right digital diagnostic mammography and breast tomosynthesis was
performed. The images were evaluated with computer-aided detection.;
Targeted ultrasound examination of the right breast was performed

[R CC synth-2D]
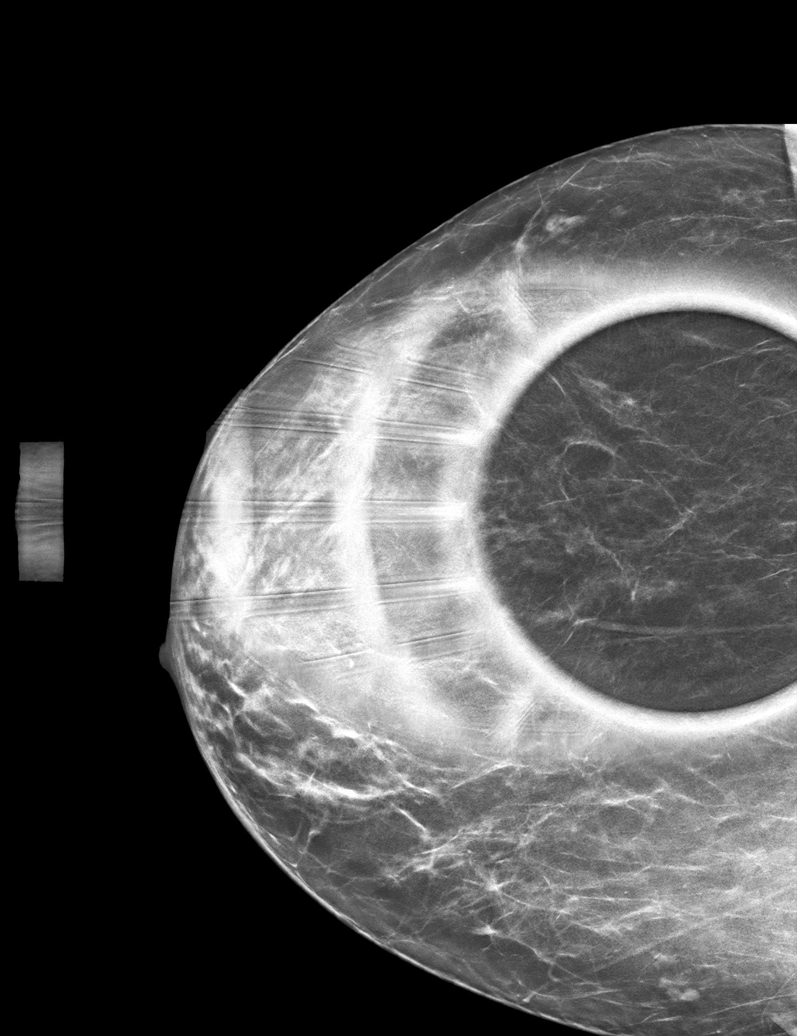

[R MLO synth-2D (1 of 2)]
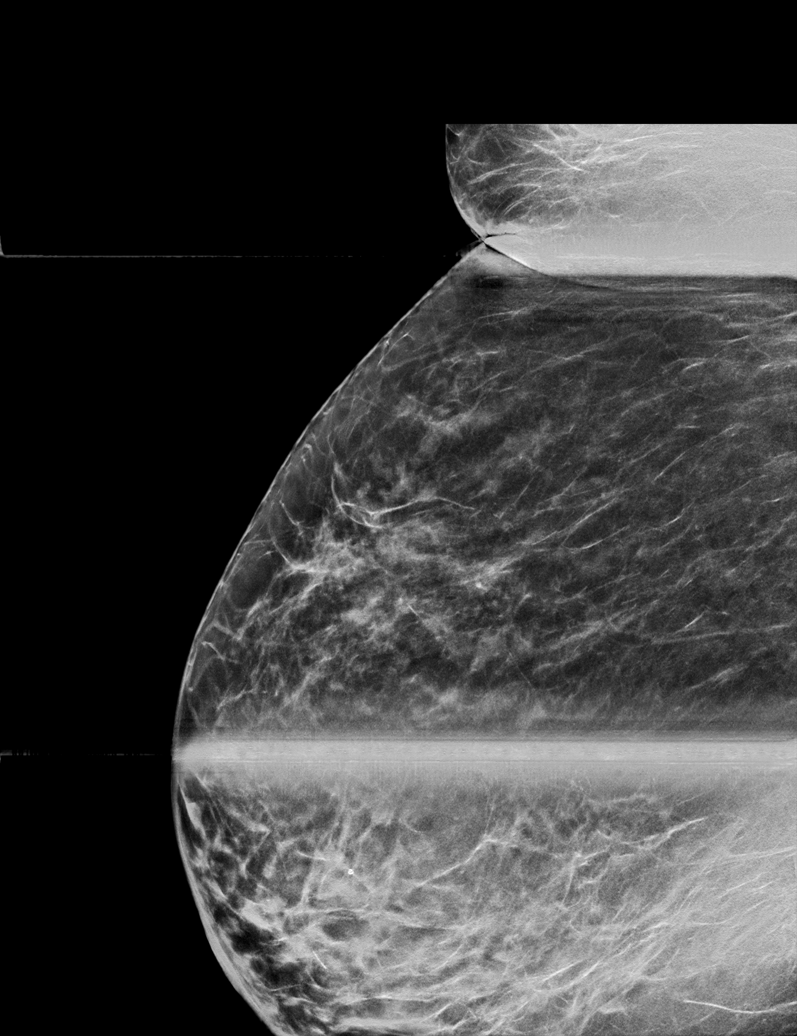

[R MLO synth-2D (2 of 2)]
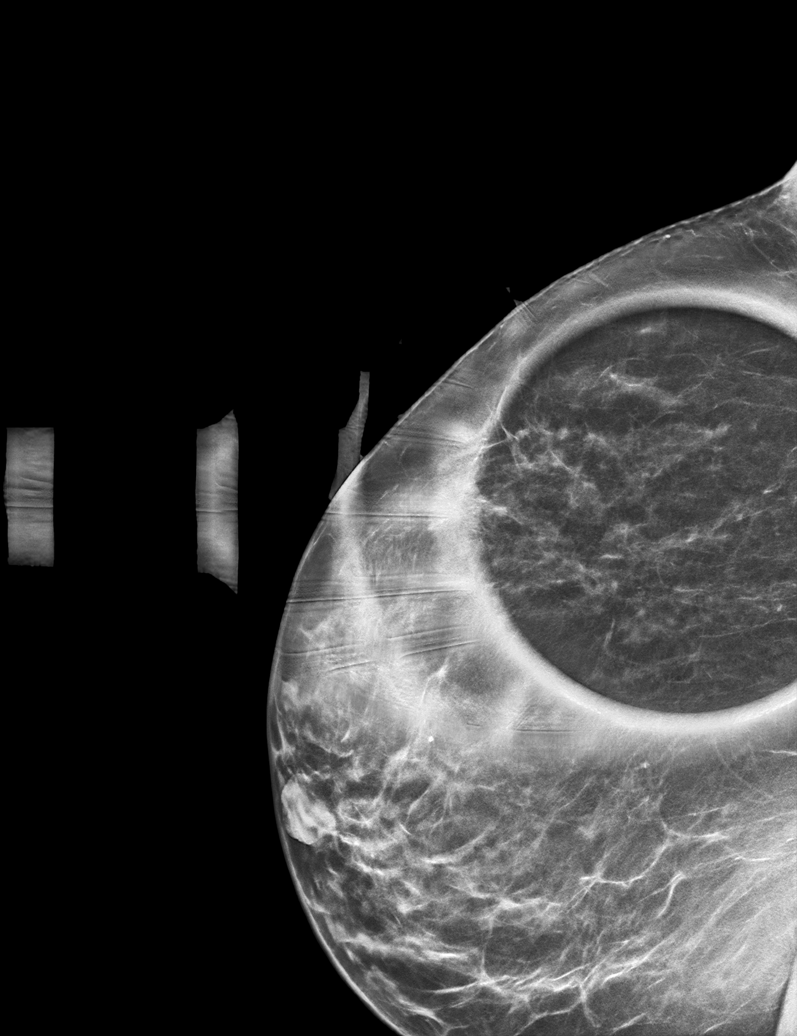

[R CC tomo · tomo slice 27/52.0]
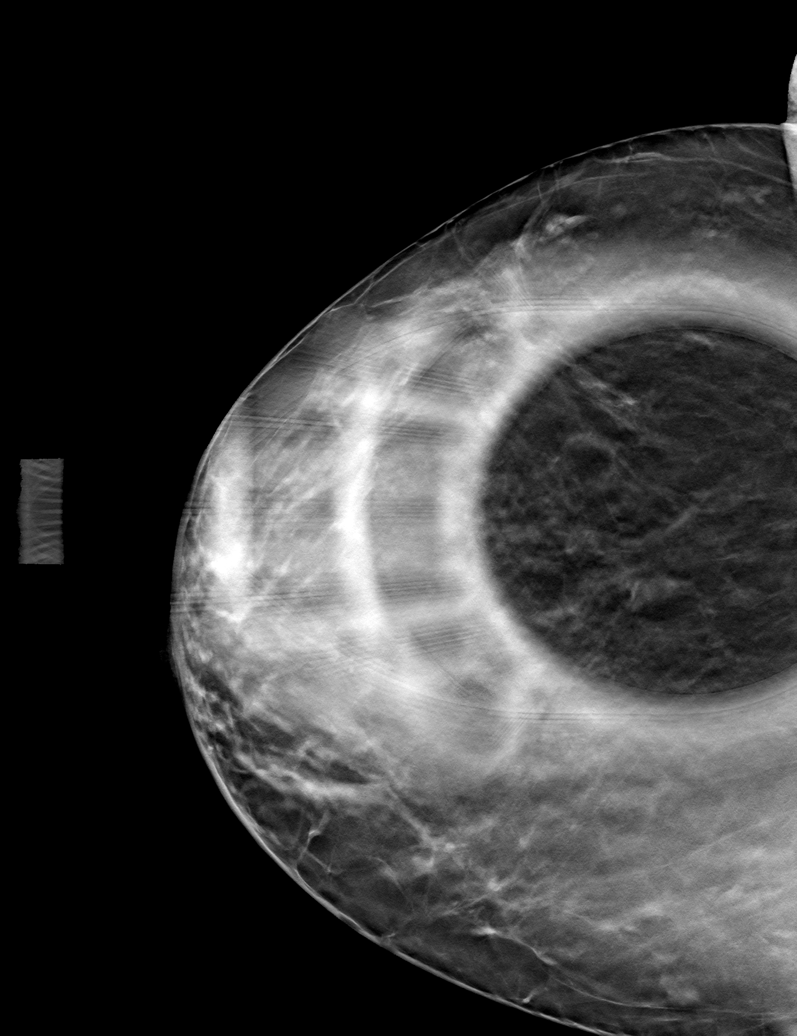

[R MLO tomo (1 of 2) · tomo slice 29/58.0]
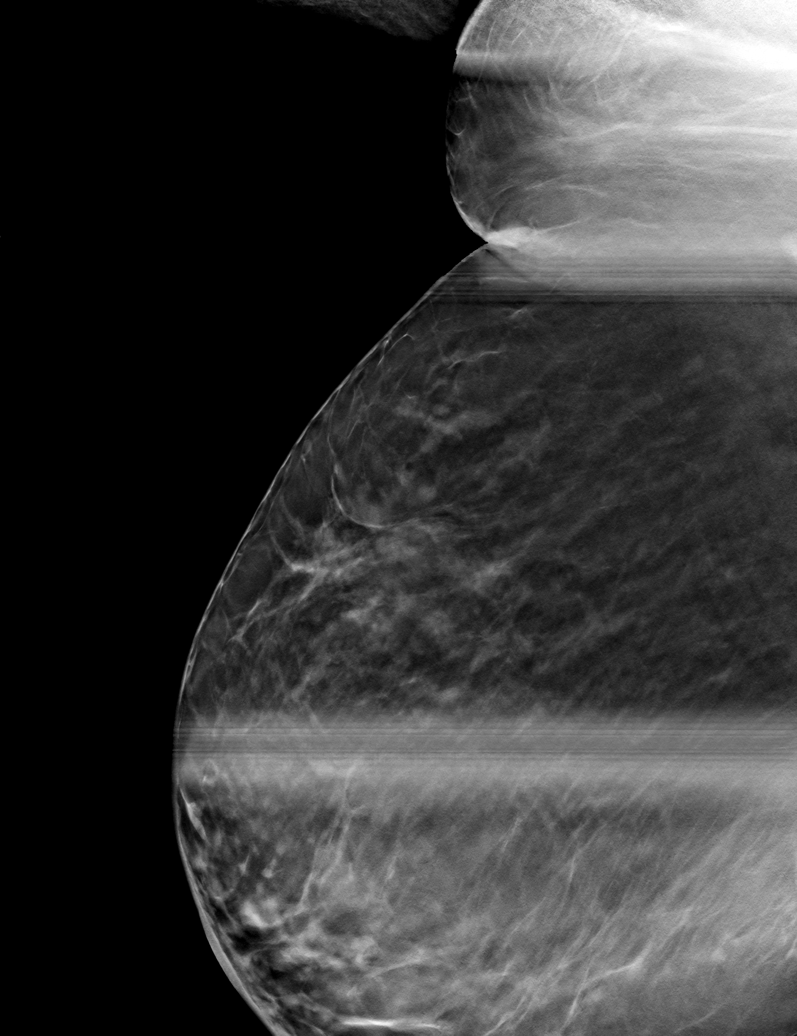

[R MLO tomo (2 of 2) · tomo slice 29/58.0]
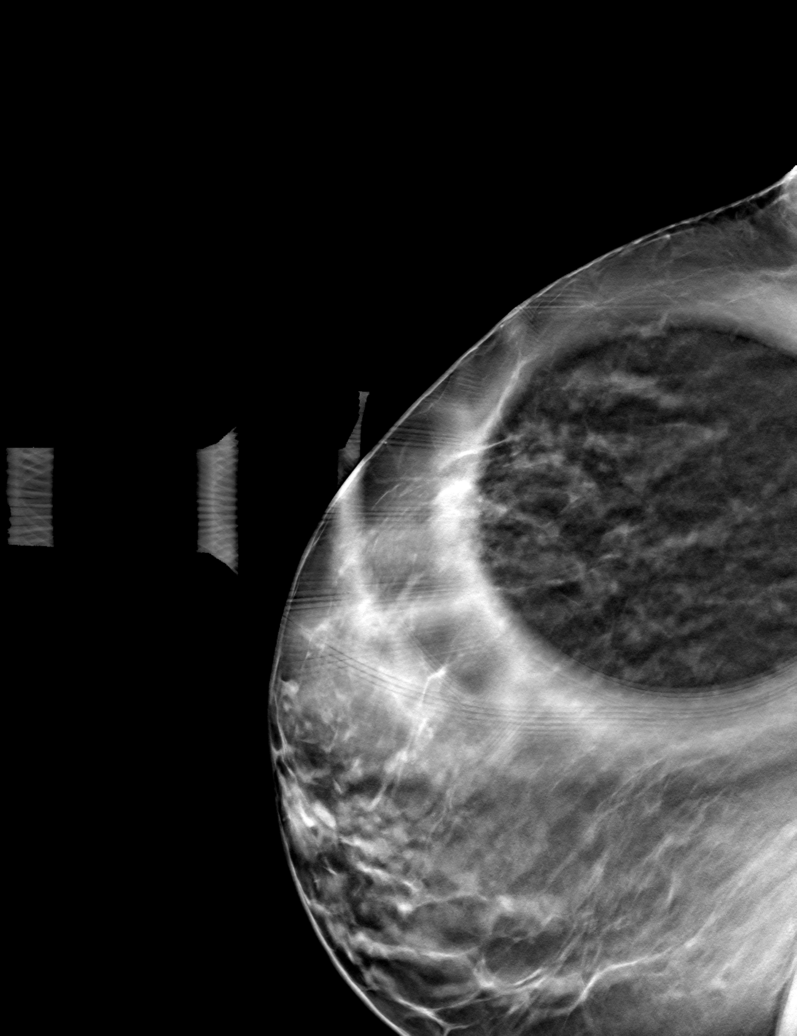

[6 of 18 positions shown; findings below may reference images not displayed]

ACR Breast Density Category b: There are scattered areas of
fibroglandular density.
FINDINGS: 3D tomographic and 2D generated spot compression images of the right
breast demonstrate normal appearing fibroglandular tissue at the
location of the recently suspected asymmetry.

On physical exam, no mass is palpable in the upper-outer right
breast or right axilla.

Targeted ultrasound is performed, showing normal appearing breast
tissue throughout the upper outer right breast. In the inferior
right axilla, there is a single markedly enlarged right axillary
lymph node with diffuse cortical thickening measuring up to 5.8 mm
in thickness. There is an adjacent normal appearing lymph node with
a cortical thickness of 1.4 mm.
IMPRESSION: 1. Single markedly enlarged right axillary lymph node.
2. The recently suspected right breast asymmetry was close
apposition of normal breast tissue.

RECOMMENDATION:
Ultrasound-guided core needle biopsy of the enlarged right axillary
lymph node. This has been discussed with the patient and scheduled
at [DATE] a.m. today.

I have discussed the findings and recommendations with the patient.
If applicable, a reminder letter will be sent to the patient
regarding the next appointment.

BI-RADS CATEGORY  4: Suspicious.

## 2022-12-20 ENCOUNTER — Telehealth: Payer: BC Managed Care – PPO | Admitting: Nurse Practitioner

## 2022-12-20 DIAGNOSIS — J40 Bronchitis, not specified as acute or chronic: Secondary | ICD-10-CM | POA: Diagnosis not present

## 2022-12-20 MED ORDER — PREDNISONE 20 MG PO TABS: 20.0000 mg | ORAL_TABLET | Freq: Two times a day (BID) | ORAL | 0 refills | Status: AC

## 2022-12-20 MED ORDER — ALBUTEROL SULFATE HFA 108 (90 BASE) MCG/ACT IN AERS: 2.0000 | INHALATION_SPRAY | Freq: Four times a day (QID) | RESPIRATORY_TRACT | 0 refills | Status: AC | PRN

## 2022-12-20 MED ORDER — AZITHROMYCIN 250 MG PO TABS: ORAL_TABLET | ORAL | 0 refills | Status: AC

## 2022-12-20 NOTE — Patient Instructions (Signed)
Start antibiotic today (Zpack)  Start prednisone today (steroid)  **take both with food   If symptoms continue start inhaler in 48 hours as needed- if symptoms improve you do not need to start inhaler   Continue Mucinex daily until cough congestion resolves   If symptoms persist or worsen beyond treatment time please follow up

## 2022-12-20 NOTE — Progress Notes (Signed)
Virtual Visit Consent   Christina Houston, you are scheduled for a virtual visit with a [facility] provider today. Just as with appointments in the office, your consent must be obtained to participate. Your consent will be active for this visit and any virtual visit you may have with one of our providers in the next 365 days. If you have a MyChart account, a copy of this consent can be sent to you electronically.  As this is a virtual visit, video technology does not allow for your provider to perform a traditional examination. This may limit your provider's ability to fully assess your condition. If your provider identifies any concerns that need to be evaluated in person or the need to arrange testing (such as labs, EKG, etc.), we will make arrangements to do so. Although advances in technology are sophisticated, we cannot ensure that it will always work on either your end or our end. If the connection with a video visit is poor, the visit may have to be switched to a telephone visit. With either a video or telephone visit, we are not always able to ensure that we have a secure connection.  By engaging in this virtual visit, you consent to the provision of healthcare and authorize for your insurance to be billed (if applicable) for the services provided during this visit. Depending on your insurance coverage, you may receive a charge related to this service.  I need to obtain your verbal consent now. Are you willing to proceed with your visit today? Christina Houston has provided verbal consent on 12/20/2022 for a virtual visit (video or telephone). Viviano Simas, FNP  Date: 12/20/2022 9:28 AM  Virtual Visit via Video Note   I, Viviano Simas, connected with  Christina Houston  ([id number], [birth year]) on 12/20/22 at  9:30 AM EST by a video-enabled telemedicine application and verified that I am speaking with the correct person using two identifiers.  Location: Patient:  Virtual Visit Location Patient: Home Provider: Virtual Visit Location Provider: Home Office   I discussed the limitations of evaluation and management by telemedicine and the availability of in person appointments. The patient expressed understanding and agreed to proceed.    History of Present Illness: Christina Houston is a 57 y.o. who identifies as a female who was assigned female at birth, and is being seen today for complaints of cough .   Symptoms have been present for 4 weeks 1st week she had sore throat & fever and was using salt water gargles and ibuprofen for relief  2nd week cough onset and has progressed, improved somewhat over Christmas and then worsened.  She now has congestion in her chest and productive cough.  Denies nasal congestion or sinus pressure.   She has bene using Mucinex DM for the past week  Has also used benadryl as well   Denies a history of asthma or COPD has not used inhalers  She has had bronchitis in the past  Denies a history of pneumonia    Problems:  Patient Active Problem List   Diagnosis Date Noted   Essential hypertension, benign 08/12/2019    Allergies: No Known Allergies Medications:  Current Outpatient Medications:    Ascorbic Acid (VITAMIN C) 100 MG CHEW, 1 tablet, Disp: , Rfl:    Calcium Carb-Cholecalciferol (CALCIUM 1000 + D) 1000-20 MG-MCG TABS, 1 tab, Disp: , Rfl:    Iron-Vitamin C (IRON 100/C) 100-250 MG TABS, 1 tablet, Disp: , Rfl:    olmesartan-hydrochlorothiazide (BENICAR  HCT) 40-25 MG tablet, Take 1 tablet by mouth daily., Disp: 90 tablet, Rfl: 1   Vitamin D, Ergocalciferol, 50 MCG (2000 UT) CAPS, 1 capsule, Disp: , Rfl:   Observations/Objective: Patient is well-developed, well-nourished in no acute distress.  Resting comfortably  at home.  Head is normocephalic, atraumatic.  No labored breathing.  Speech is clear and coherent with logical content.  Patient is alert and oriented at baseline.    Assessment  and Plan: 1. Bronchitis Continue Mucinex  Push fluids & rest  Assure caloric intake with high protein foods to assist in recovery   Meds ordered this encounter  Medications   albuterol (VENTOLIN HFA) 108 (90 Base) MCG/ACT inhaler    Sig: Inhale 2 puffs into the lungs every 6 (six) hours as needed for wheezing or shortness of breath.    Dispense:  8 g    Refill:  0   azithromycin (ZITHROMAX) 250 MG tablet    Sig: Take 2 tablets on day 1, then 1 tablet daily on days 2 through 5    Dispense:  6 tablet    Refill:  0   predniSONE (DELTASONE) 20 MG tablet    Sig: Take 1 tablet (20 mg total) by mouth 2 (two) times daily with a meal for 5 days.    Dispense:  10 tablet    Refill:  0     Follow Up Instructions: I discussed the assessment and treatment plan with the patient. The patient was provided an opportunity to ask questions and all were answered. The patient agreed with the plan and demonstrated an understanding of the instructions.  A copy of instructions were sent to the patient via MyChart unless otherwise noted below.    The patient was advised to call back or seek an in-person evaluation if the symptoms worsen or if the condition fails to improve as anticipated.  Time:  I spent 10 minutes with the patient via telehealth technology discussing the above problems/concerns.    Apolonio Schneiders, FNP

## 2023-02-01 ENCOUNTER — Encounter: Payer: BC Managed Care – PPO | Admitting: Nurse Practitioner

## 2023-02-07 ENCOUNTER — Ambulatory Visit (INDEPENDENT_AMBULATORY_CARE_PROVIDER_SITE_OTHER): Payer: BC Managed Care – PPO | Admitting: Nurse Practitioner

## 2023-02-07 ENCOUNTER — Encounter: Payer: Self-pay | Admitting: Nurse Practitioner

## 2023-02-07 VITALS — BP 130/88 | HR 51 | Temp 98.4°F | Ht 64.0 in | Wt 204.0 lb

## 2023-02-07 DIAGNOSIS — I10 Essential (primary) hypertension: Secondary | ICD-10-CM | POA: Diagnosis not present

## 2023-02-07 DIAGNOSIS — M79671 Pain in right foot: Secondary | ICD-10-CM | POA: Diagnosis not present

## 2023-02-07 DIAGNOSIS — Z6835 Body mass index (BMI) 35.0-35.9, adult: Secondary | ICD-10-CM | POA: Diagnosis not present

## 2023-02-07 DIAGNOSIS — Z0000 Encounter for general adult medical examination without abnormal findings: Secondary | ICD-10-CM | POA: Diagnosis not present

## 2023-02-07 DIAGNOSIS — R7309 Other abnormal glucose: Secondary | ICD-10-CM | POA: Insufficient documentation

## 2023-02-07 DIAGNOSIS — Z13220 Encounter for screening for lipoid disorders: Secondary | ICD-10-CM

## 2023-02-07 DIAGNOSIS — Z136 Encounter for screening for cardiovascular disorders: Secondary | ICD-10-CM

## 2023-02-07 DIAGNOSIS — Z79899 Other long term (current) drug therapy: Secondary | ICD-10-CM

## 2023-02-07 DIAGNOSIS — Z23 Encounter for immunization: Secondary | ICD-10-CM | POA: Diagnosis not present

## 2023-02-07 DIAGNOSIS — Z5320 Procedure and treatment not carried out because of patient's decision for unspecified reasons: Secondary | ICD-10-CM

## 2023-02-07 DIAGNOSIS — Z1321 Encounter for screening for nutritional disorder: Secondary | ICD-10-CM

## 2023-02-07 DIAGNOSIS — K5904 Chronic idiopathic constipation: Secondary | ICD-10-CM | POA: Insufficient documentation

## 2023-02-07 NOTE — Patient Instructions (Signed)
You can take Black Seed oil to help with blood pressure control.

## 2023-02-07 NOTE — Progress Notes (Signed)
I,Sheena H Holbrook,acting as a Education administrator for Minette Brine, FNP.,have documented all relevant documentation on the behalf of Minette Brine, FNP,as directed by  Minette Brine, FNP while in the presence of Minette Brine, [place].   Subjective:     Patient ID: Christina Houston , female    DOB: 07-11-1966 , 57 y.o.   MRN: [id number]   Chief Complaint  Patient presents with   Annual Exam    HPI  Patient presents today for annual exam. She sees Dr. Christophe Louis for her GYN needs. Patient requests referral to podiatry for right foot pain; denies injury, thinks from being on feet at work.   She took her blood pressure medications today around 8a. She started taking her blood pressure medications every day since last week.   Wt Readings from Last 3 Encounters: 02/07/23 : 204 lb (92.5 kg) 05/02/22 : 218 lb (98.9 kg) 01/31/22 : 220 lb 6.4 oz (100 kg)     Foot Injury  The incident occurred more than 1 week ago. There was no injury mechanism. Pain location: right foot worse during the week when working stands on concrete floor duing the day. The quality of the pain is described as aching. The pain is mild. She has tried nothing for the symptoms.     Past Medical History:  Diagnosis Date   Hypertension      Family History  Problem Relation Age of Onset   Breast cancer Mother    Breast cancer Cousin    Liver disease Father      Current Outpatient Medications:    albuterol (VENTOLIN HFA) 108 (90 Base) MCG/ACT inhaler, Inhale 2 puffs into the lungs every 6 (six) hours as needed for wheezing or shortness of breath., Disp: 8 g, Rfl: 0   Ascorbic Acid (VITAMIN C) 100 MG CHEW, 1 tablet, Disp: , Rfl:    Calcium Carb-Cholecalciferol (CALCIUM 1000 + D) 1000-20 MG-MCG TABS, 1 tab, Disp: , Rfl:    olmesartan-hydrochlorothiazide (BENICAR HCT) 40-25 MG tablet, Take 1 tablet by mouth daily., Disp: 90 tablet, Rfl: 1   Iron-Vitamin C (IRON 100/C) 100-250 MG TABS, 1 tablet (Patient not taking:  Reported on 02/07/2023), Disp: , Rfl:    No Known Allergies    The patient states she is  tubal ligation.  She is perimenopausal. No LMP recorded.. Negative for Dysmenorrhea and Negative for Menorrhagia. Negative for: breast discharge, breast lump(s), breast pain and breast self exam. Associated symptoms include abnormal vaginal bleeding. Pertinent negatives include abnormal bleeding (hematology), anxiety, decreased libido, depression, difficulty falling sleep, dyspareunia, history of infertility, nocturia, sexual dysfunction, sleep disturbances, urinary incontinence, urinary urgency, vaginal discharge and vaginal itching. Diet regular; intermittent fasting. At work they are doing a weight challenge. She was 222 lbs and now down to 204 lbs. She is trying to portion her eating. The patient states her exercise level is moderate - vigorous 4-5 times a week for 45 minutes to 1 hour. She is walking and just started going to gym with treadmill, bike and strength training.   The patient's tobacco use is:  Social History   Tobacco Use  Smoking Status Never  Smokeless Tobacco Never   She has been exposed to passive smoke. The patient's alcohol use is:  Social History   Substance and Sexual Activity  Alcohol Use No  Additional information: Last pap 05/27/2022, next one scheduled for 05/27/2025.    Review of Systems  Constitutional: Negative.  Negative for fatigue.  HENT: Negative.    Eyes:  Negative.   Respiratory: Negative.  Negative for shortness of breath and wheezing.   Cardiovascular: Negative.  Negative for chest pain, palpitations and leg swelling.  Gastrointestinal: Negative.   Endocrine: Negative.   Genitourinary: Negative.   Musculoskeletal: Negative.   Skin: Negative.   Allergic/Immunologic: Negative.   Neurological: Negative.   Hematological: Negative.   Psychiatric/Behavioral: Negative.  Negative for sleep disturbance.      Today's Vitals   02/07/23 0840 02/07/23 0932  BP: (!)  138/102 130/88  Pulse: (!) 51   Temp: 98.4 F (36.9 C)   TempSrc: Oral   SpO2: 99%   Weight: 204 lb (92.5 kg)   Height: '5\' 4"'$  (1.626 m)    Body mass index is 35.02 kg/m.   Objective:  Physical Exam Vitals reviewed.  Constitutional:      General: She is not in acute distress.    Appearance: Normal appearance. She is well-developed. She is obese.  HENT:     Head: Normocephalic and atraumatic.     Right Ear: Hearing, tympanic membrane, ear canal and external ear normal. There is no impacted cerumen.     Left Ear: Hearing, tympanic membrane, ear canal and external ear normal. There is no impacted cerumen.     Nose:     Comments: Deferred - masked    Mouth/Throat:     Comments: Deferred - masked Eyes:     General: Lids are normal.     Extraocular Movements: Extraocular movements intact.     Conjunctiva/sclera: Conjunctivae normal.     Pupils: Pupils are equal, round, and reactive to light.     Funduscopic exam:    Right eye: No papilledema.        Left eye: No papilledema.  Neck:     Thyroid: No thyroid mass.     Vascular: No carotid bruit.  Cardiovascular:     Rate and Rhythm: Normal rate and regular rhythm.     Pulses: Normal pulses.     Heart sounds: Normal heart sounds. No murmur heard. Pulmonary:     Effort: Pulmonary effort is normal. No respiratory distress.     Breath sounds: Normal breath sounds. No wheezing.  Chest:     Chest wall: No mass.  Breasts:    Tanner Score is 5.     Right: Normal. No mass or tenderness.     Left: Normal. No mass or tenderness.  Abdominal:     General: Abdomen is flat. Bowel sounds are normal. There is no distension.     Palpations: Abdomen is soft.     Tenderness: There is no abdominal tenderness.  Musculoskeletal:        General: No swelling. Normal range of motion.     Cervical back: Full passive range of motion without pain, normal range of motion and neck supple.     Right lower leg: No edema.     Left lower leg: No edema.   Lymphadenopathy:     Upper Body:     Right upper body: No supraclavicular, axillary or pectoral adenopathy.     Left upper body: No supraclavicular, axillary or pectoral adenopathy.  Skin:    General: Skin is warm and dry.     Capillary Refill: Capillary refill takes less than 2 seconds.  Neurological:     General: No focal deficit present.     Mental Status: She is alert and oriented to person, place, and time.     Cranial Nerves: No cranial nerve deficit.  Sensory: No sensory deficit.  Psychiatric:        Mood and Affect: Mood normal.        Behavior: Behavior normal.        Thought Content: Thought content normal.        Judgment: Judgment normal.         Assessment And Plan:     1. Routine general medical examination at a health care facility Behavior modifications discussed and diet history reviewed.   Pt will continue to exercise regularly and modify diet with low GI, plant based foods and decrease intake of processed foods.  Recommend intake of daily multivitamin, Vitamin D, and calcium.  Recommend mammogram and colonoscopy for preventive screenings, as well as recommend immunizations that include influenza, TDAP, and Shingles  2. Encounter for lipid screening for cardiovascular disease - Lipid panel  3. Encounter for vitamin deficiency screening Will check vitamin D level and supplement as needed.    Also encouraged to spend 15 minutes in the sun daily.  - VITAMIN D 25 Hydroxy (Vit-D Deficiency, Fractures)  4. Need for zoster vaccination  - Zoster Recombinant (Shingrix )  5. HIV screening declined  6. BMI 35.0-35.9,adult She is encouraged to strive for BMI less than 30 to decrease cardiac risk. Advised to aim for at least 150 minutes of exercise per week.  7. Essential hypertension, benign Comments: Blood pressure elevated when reviewing adherence had a 0% fill since 08/2022. Discussed adherence and risk for heart attack, stroke and CHF. Low salt diet - EKG  12-Lead - CMP14+EGFR  8. Abnormal glucose Comments: HgbA1c was up to 6.3 at last visit, encouraged to limit intake of sugary foods and drinks. Continue with regular exercise. - Hemoglobin A1c  9. Right foot pain Comments: Tenderness to right lateral foot, will refer to podiatry. - Ambulatory referral to Podiatry  10. Other long term (current) drug therapy - CBC     Patient was given opportunity to ask questions. Patient verbalized understanding of the plan and was able to repeat key elements of the plan. All questions were answered to their satisfaction.   Minette Brine, FNP   I, Minette Brine, FNP, have reviewed all documentation for this visit. The documentation on 02/07/23 for the exam, diagnosis, procedures, and orders are all accurate and complete.   THE PATIENT IS ENCOURAGED TO PRACTICE SOCIAL DISTANCING DUE TO THE COVID-19 PANDEMIC.

## 2023-02-08 LAB — LIPID PANEL
Chol/HDL Ratio: 2.4 ratio (ref 0.0–4.4)
Cholesterol, Total: 201 mg/dL — ABNORMAL HIGH (ref 100–199)
HDL: 85 mg/dL (ref >39)
LDL Chol Calc (NIH): 106 mg/dL — ABNORMAL HIGH (ref 0–99)
Triglycerides: 54 mg/dL (ref 0–149)
VLDL Cholesterol Cal: 10 mg/dL (ref 5–40)

## 2023-02-08 LAB — CBC
Hematocrit: 38.6 % (ref 34.0–46.6)
Hemoglobin: 12.3 g/dL (ref 11.1–15.9)
MCH: 27.3 pg (ref 26.6–33.0)
MCHC: 31.9 g/dL (ref 31.5–35.7)
MCV: 86 fL (ref 79–97)
Platelets: 308 10*3/uL (ref 150–450)
RBC: 4.5 x10E6/uL (ref 3.77–5.28)
RDW: 13.2 % (ref 11.7–15.4)
WBC: 4.7 10*3/uL (ref 3.4–10.8)

## 2023-02-08 LAB — CMP14+EGFR
ALT: 16 IU/L (ref 0–32)
AST: 12 IU/L (ref 0–40)
Albumin/Globulin Ratio: 1.3 (ref 1.2–2.2)
Albumin: 4.3 g/dL (ref 3.8–4.9)
Alkaline Phosphatase: 65 IU/L (ref 44–121)
BUN/Creatinine Ratio: 10 (ref 9–23)
BUN: 10 mg/dL (ref 6–24)
Bilirubin Total: 0.7 mg/dL (ref 0.0–1.2)
CO2: 21 mmol/L (ref 20–29)
Calcium: 10 mg/dL (ref 8.7–10.2)
Chloride: 102 mmol/L (ref 96–106)
Creatinine, Ser: 0.99 mg/dL (ref 0.57–1.00)
Globulin, Total: 3.2 g/dL (ref 1.5–4.5)
Glucose: 91 mg/dL (ref 70–99)
Potassium: 4.4 mmol/L (ref 3.5–5.2)
Sodium: 140 mmol/L (ref 134–144)
Total Protein: 7.5 g/dL (ref 6.0–8.5)
eGFR: 68 mL/min/{1.73_m2} (ref >59)

## 2023-02-08 LAB — HEMOGLOBIN A1C
Est. average glucose Bld gHb Est-mCnc: 128 mg/dL
Hgb A1c MFr Bld: 6.1 % — ABNORMAL HIGH (ref 4.8–5.6)

## 2023-02-08 LAB — VITAMIN D 25 HYDROXY (VIT D DEFICIENCY, FRACTURES): Vit D, 25-Hydroxy: 44.5 ng/mL (ref 30.0–100.0)

## 2023-02-14 ENCOUNTER — Ambulatory Visit (INDEPENDENT_AMBULATORY_CARE_PROVIDER_SITE_OTHER): Payer: BC Managed Care – PPO

## 2023-02-14 ENCOUNTER — Ambulatory Visit (INDEPENDENT_AMBULATORY_CARE_PROVIDER_SITE_OTHER): Payer: BC Managed Care – PPO | Admitting: Podiatry

## 2023-02-14 DIAGNOSIS — Q66222 Congenital metatarsus adductus, left foot: Secondary | ICD-10-CM

## 2023-02-14 DIAGNOSIS — M19079 Primary osteoarthritis, unspecified ankle and foot: Secondary | ICD-10-CM | POA: Diagnosis not present

## 2023-02-14 DIAGNOSIS — M2042 Other hammer toe(s) (acquired), left foot: Secondary | ICD-10-CM | POA: Diagnosis not present

## 2023-02-14 DIAGNOSIS — M7752 Other enthesopathy of left foot: Secondary | ICD-10-CM

## 2023-02-14 DIAGNOSIS — M7750 Other enthesopathy of unspecified foot: Secondary | ICD-10-CM

## 2023-02-14 DIAGNOSIS — M7751 Other enthesopathy of right foot: Secondary | ICD-10-CM | POA: Diagnosis not present

## 2023-02-14 DIAGNOSIS — Q66221 Congenital metatarsus adductus, right foot: Secondary | ICD-10-CM

## 2023-02-14 DIAGNOSIS — M2141 Flat foot [pes planus] (acquired), right foot: Secondary | ICD-10-CM

## 2023-02-14 NOTE — Patient Instructions (Signed)
Look for urea 40% cream or ointment and apply to the thickened dry skin / calluses. This can be bought over the counter, at a pharmacy or online such as [employer].   More silicone pads can be purchased from:  [email or web address]      Look for Voltaren gel at the pharmacy over the counter or online (also known as diclofenac 1% gel). Apply to the painful areas 3-4x daily with the supplied dosing card. Allow to dry for 10 minutes before going into socks/shoes

## 2023-02-15 ENCOUNTER — Encounter: Payer: Self-pay | Admitting: Podiatry

## 2023-02-15 NOTE — Progress Notes (Signed)
  Subjective:  Patient ID: Christina Houston, female    DOB: July 21, 1966,  MRN: [id number]  Chief Complaint  Patient presents with   Foot Pain    bilateral foot pain, lateral side of the foot, started a year ago, X-rays done today     57 y.o. female presents with the above complaint. History confirmed with patient.  Does not recall any injury.  She does have a history of bunion surgery on both feet  Objective:  Physical Exam: warm, good capillary refill, no trophic changes or ulcerative lesions, normal DP and PT pulses, normal sensory exam, and diffuse pain and tenderness on the fourth and fifth cuboid articulation of the metatarsal base, mild edema, no bruising or ecchymosis no instability.  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  Dorsal corn PIPJ second hammertoe left  Radiographs: Multiple views x-ray of both feet: Well-healed metatarsal osteotomies with screw fixation, slight recurrence of hallux valgus deformity, tailor's bunion noted and splayfoot type with deviation and separation of the first and second and fourth and fifth intermetatarsal angle, she has pes planus deformity, mild degenerative changes noted in the fifth cuboid articulation on the oblique view, no clear evidence of stress fracture Assessment:   1. Arthritis, midfoot   2. Hammertoe of left foot   3. Pes planus of both feet   4. Metatarsus adductus of both feet      Plan:  Patient was evaluated and treated and all questions answered.  We discussed her foot type and how it contributes to strain in the midfoot.  We discussed treatment of this long-term and development of arthritis.  Discussed using topical medication such as Voltaren gel.  We also discussed shoe gear changes, she had some scattered on which overall appeared to be fairly supportive but appear to be quite narrow for her foot shape with her splayfoot and pes planus.  I recommended  a wider supportive shoe and something as a stretching or type material to allow pressure relief on the foot.  We also discussed supporting the foot with a custom molded foot orthosis.  She was casted for these today.  Could consider corticosteroid injection if not improving.  She also has hammertoe deformity of both second hammertoes, left is worse and she has a dorsal corn here, offloading pad dispensed.  Also discussed surgical correction of this if it did not improve.  No follow-ups on file.

## 2023-03-05 NOTE — Addendum Note (Signed)
Addended bySherryle Lis, Dimas Scheck R on: 03/05/2023 06:57 AM   Modules accepted: Orders

## 2023-03-22 ENCOUNTER — Other Ambulatory Visit: Payer: BC Managed Care – PPO

## 2023-04-17 ENCOUNTER — Other Ambulatory Visit: Payer: BC Managed Care – PPO

## 2023-05-25 ENCOUNTER — Ambulatory Visit: Payer: BC Managed Care – PPO | Admitting: Nurse Practitioner

## 2023-05-25 ENCOUNTER — Encounter: Payer: Self-pay | Admitting: Nurse Practitioner

## 2023-05-25 VITALS — BP 160/100 | HR 65 | Temp 98.0°F | Ht 64.0 in | Wt 194.0 lb

## 2023-05-25 DIAGNOSIS — Z23 Encounter for immunization: Secondary | ICD-10-CM

## 2023-05-25 DIAGNOSIS — E782 Mixed hyperlipidemia: Secondary | ICD-10-CM | POA: Diagnosis not present

## 2023-05-25 DIAGNOSIS — I10 Essential (primary) hypertension: Secondary | ICD-10-CM

## 2023-05-25 DIAGNOSIS — E7800 Pure hypercholesterolemia, unspecified: Secondary | ICD-10-CM

## 2023-05-25 DIAGNOSIS — R7309 Other abnormal glucose: Secondary | ICD-10-CM

## 2023-05-25 LAB — LIPID PANEL
Chol/HDL Ratio: 2.2 ratio (ref 0.0–4.4)
Cholesterol, Total: 237 mg/dL — ABNORMAL HIGH (ref 100–199)
HDL: 108 mg/dL (ref >39)
LDL Chol Calc (NIH): 116 mg/dL — ABNORMAL HIGH (ref 0–99)
Triglycerides: 79 mg/dL (ref 0–149)
VLDL Cholesterol Cal: 13 mg/dL (ref 5–40)

## 2023-05-25 LAB — HEMOGLOBIN A1C
Est. average glucose Bld gHb Est-mCnc: 126 mg/dL
Hgb A1c MFr Bld: 6 % — ABNORMAL HIGH (ref 4.8–5.6)

## 2023-05-25 MED ORDER — OLMESARTAN MEDOXOMIL-HCTZ 40-25 MG PO TABS: 1.0000 | ORAL_TABLET | Freq: Every day | ORAL | 1 refills | Status: DC

## 2023-05-25 NOTE — Progress Notes (Signed)
I,Miyanna Wiersma,acting as a Neurosurgeon for Arnette Felts, FNP.,have documented all relevant documentation on the behalf of Arnette Felts, FNP,as directed by  Arnette Felts, FNP while in the presence of Arnette Felts, FNP.  Subjective:  Patient ID: Christina Houston , female    DOB: March 29, 1966 , 57 y.o.   MRN: [id number]  Chief Complaint  Patient presents with   Hypertension    HPI  Patient presents today to for a BP check, patient reports she has been out of the [facility] for about a week but she has been taking expired ones she had at home.  patient reports compliance with medications and has no other concerns today. Patient denies any chest pain, SOB, or headaches.   She had a weight loss competition at her job and has been intermittent fasting 12p - 7p. She is feeling better.   BP Readings from Last 3 Encounters: 05/25/23 : (!) 170/100 02/07/23 : 130/88 05/02/22 : 124/80       Past Medical History:  Diagnosis Date   Hypertension      Family History  Problem Relation Age of Onset   Breast cancer Mother    Breast cancer Cousin    Liver disease Father      Current Outpatient Medications:    albuterol (VENTOLIN HFA) 108 (90 Base) MCG/ACT inhaler, Inhale 2 puffs into the lungs every 6 (six) hours as needed for wheezing or shortness of breath., Disp: 8 g, Rfl: 0   Ascorbic Acid (VITAMIN C) 100 MG CHEW, 1 tablet, Disp: , Rfl:    Calcium Carb-Cholecalciferol (CALCIUM 1000 + D) 1000-20 MG-MCG TABS, 1 tab, Disp: , Rfl:    Iron-Vitamin C (IRON 100/C) 100-250 MG TABS, 1 tablet (Patient not taking: Reported on 02/07/2023), Disp: , Rfl:    olmesartan-hydrochlorothiazide (BENICAR HCT) 40-25 MG tablet, Take 1 tablet by mouth daily., Disp: 90 tablet, Rfl: 1   No Known Allergies   Review of Systems  Constitutional: Negative.   Respiratory: Negative.    Cardiovascular: Negative.   Neurological: Negative.   Psychiatric/Behavioral: Negative.       Today's Vitals   05/25/23  0942 05/25/23 1040  BP: (!) 170/100 (!) 160/100  Pulse: 65   Temp: 98 F (36.7 C)   TempSrc: Oral   Weight: 194 lb (88 kg)   Height: 5\' 4"  (1.626 m)   PainSc: 0-No pain    Body mass index is 33.3 kg/m.  Wt Readings from Last 3 Encounters:  05/25/23 194 lb (88 kg)  02/07/23 204 lb (92.5 kg)  05/02/22 218 lb (98.9 kg)    The ASCVD Risk score (Arnett DK, et al., 2019) failed to calculate for the following reasons:   The valid HDL cholesterol range is 20 to 100 mg/dL  Objective:  Physical Exam Vitals reviewed.  Constitutional:      General: She is not in acute distress.    Appearance: Normal appearance. She is obese.  Cardiovascular:     Rate and Rhythm: Normal rate and regular rhythm.     Pulses: Normal pulses.     Heart sounds: Normal heart sounds. No murmur heard. Pulmonary:     Effort: Pulmonary effort is normal. No respiratory distress.     Breath sounds: Normal breath sounds. No wheezing.  Skin:    General: Skin is warm and dry.     Capillary Refill: Capillary refill takes less than 2 seconds.  Neurological:     General: No focal deficit present.     Mental Status:  She is alert and oriented to person, place, and time.     Cranial Nerves: No cranial nerve deficit.     Motor: No weakness.         Assessment And Plan:  1. Essential hypertension, benign Comments: Blood pressure is elevated, she had been out of her mediations and reminded her to call office if runs out of medication. Repeat is slightly better. - olmesartan-hydrochlorothiazide (BENICAR HCT) 40-25 MG tablet; Take 1 tablet by mouth daily.  Dispense: 90 tablet; Refill: 1  2. Abnormal glucose Comments: HgbA1c is stable. Continue focusing on healthy diet and regular exercise - Hemoglobin A1c  3. Mixed hyperlipidemia Comments: Cholesterol levels are improved at last visit, no current medications. Continue low fat diet. - Lipid panel  4. Need for zoster vaccination Comments: 2nd shingrix vaccine given  in office - Zoster Recombinant (Shingrix )    Return for 6 month bp check.  Patient was given opportunity to ask questions. Patient verbalized understanding of the plan and was able to repeat key elements of the plan. All questions were answered to their satisfaction.  Arnette Felts, FNP  I, Arnette Felts, FNP, have reviewed all documentation for this visit. The documentation on 05/25/23 for the exam, diagnosis, procedures, and orders are all accurate and complete.   IF YOU HAVE BEEN REFERRED TO A SPECIALIST, IT MAY TAKE 1-2 WEEKS TO SCHEDULE/PROCESS THE REFERRAL. IF YOU HAVE NOT HEARD FROM US/SPECIALIST IN TWO WEEKS, PLEASE GIVE [place] A CALL AT [phone].

## 2023-06-07 DIAGNOSIS — E782 Mixed hyperlipidemia: Secondary | ICD-10-CM | POA: Insufficient documentation

## 2023-07-14 ENCOUNTER — Other Ambulatory Visit: Payer: Self-pay | Admitting: Internal Medicine

## 2023-07-14 DIAGNOSIS — Z1231 Encounter for screening mammogram for malignant neoplasm of breast: Secondary | ICD-10-CM

## 2023-07-21 ENCOUNTER — Ambulatory Visit: Admission: RE | Admit: 2023-07-21 | Payer: BC Managed Care – PPO | Source: Ambulatory Visit

## 2023-07-21 DIAGNOSIS — Z1231 Encounter for screening mammogram for malignant neoplasm of breast: Secondary | ICD-10-CM

## 2023-12-12 ENCOUNTER — Other Ambulatory Visit: Payer: Self-pay | Admitting: Internal Medicine

## 2023-12-12 DIAGNOSIS — Z1231 Encounter for screening mammogram for malignant neoplasm of breast: Secondary | ICD-10-CM

## 2024-01-02 ENCOUNTER — Ambulatory Visit: Payer: 59 | Admitting: Nurse Practitioner

## 2024-01-02 ENCOUNTER — Encounter: Payer: Self-pay | Admitting: Nurse Practitioner

## 2024-01-02 VITALS — BP 130/90 | HR 62 | Temp 98.6°F | Ht 64.0 in | Wt 198.2 lb

## 2024-01-02 DIAGNOSIS — E782 Mixed hyperlipidemia: Secondary | ICD-10-CM | POA: Diagnosis not present

## 2024-01-02 DIAGNOSIS — Z6834 Body mass index (BMI) 34.0-34.9, adult: Secondary | ICD-10-CM

## 2024-01-02 DIAGNOSIS — Z2821 Immunization not carried out because of patient refusal: Secondary | ICD-10-CM

## 2024-01-02 DIAGNOSIS — E6609 Other obesity due to excess calories: Secondary | ICD-10-CM

## 2024-01-02 DIAGNOSIS — R7309 Other abnormal glucose: Secondary | ICD-10-CM

## 2024-01-02 DIAGNOSIS — E66811 Obesity, class 1: Secondary | ICD-10-CM | POA: Diagnosis not present

## 2024-01-02 DIAGNOSIS — I10 Essential (primary) hypertension: Secondary | ICD-10-CM

## 2024-01-02 NOTE — Progress Notes (Signed)
Madelaine Bhat, CMA,acting as a Neurosurgeon for Arnette Felts, FNP.,have documented all relevant documentation on the behalf of Arnette Felts, FNP,as directed by  Arnette Felts, FNP while in the presence of Arnette Felts, FNP.  Subjective:  Patient ID: Christina Houston , female    DOB: [birth year] , 58 y.o.   MRN: [id number]  Chief Complaint  Patient presents with   Hypertension    HPI  Patient presents today for a bp follow up, Patient reports compliance with medication. Patient denies any chest pain, SOB, or headaches. Patient has no concerns today. She does a lot of walking while at work. At home does not get much walking in. She does a lot intermittent fasting at 12pm - 7p. She may eat one meal during that time - no meat diet for the first part of the year. Salad and vegetables. She does eat sweet potatoes. Does not snack a lot. She does admit to staying up late and getting up early. 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   She does drink a lot of lemon water.      Past Medical History:  Diagnosis Date   Hypertension      Family History  Problem Relation Age of Onset   Breast cancer Mother    Breast cancer Cousin    Liver disease Father      Current Outpatient Medications:    albuterol (VENTOLIN HFA) 108 (90 Base) MCG/ACT inhaler, Inhale 2 puffs into the lungs every 6 (six) hours as needed for wheezing or shortness of breath., Disp: 8 g, Rfl: 0   Ascorbic Acid (VITAMIN C) 100 MG CHEW, 1 tablet, Disp: , Rfl:    Calcium Carb-Cholecalciferol (CALCIUM 1000 + D) 1000-20 MG-MCG TABS, 1 tab, Disp: , Rfl:    Iron-Vitamin C (IRON 100/C) 100-250 MG TABS, , Disp: , Rfl:    olmesartan-hydrochlorothiazide (BENICAR HCT) 40-25 MG tablet, Take 1 tablet by mouth daily., Disp: 90 tablet, Rfl: 1   No Known Allergies   Review of Systems  Constitutional: Negative.   Respiratory: Negative.    Cardiovascular: Negative.   Neurological: Negative.    Psychiatric/Behavioral: Negative.         01/02/2024    3:53 PM 02/07/2023    8:44 AM 09/05/2022    1:52 PM 08/24/2021    9:58 AM 08/12/2019   10:00 AM  Depression screen PHQ 2/9  Decreased Interest 0 0 0 0 0  Down, Depressed, Hopeless 0 0 0 0 0  PHQ - 2 Score 0 0 0 0 0  Altered sleeping 0      Tired, decreased energy 0      Change in appetite 0      Feeling bad or failure about yourself  0      Trouble concentrating 0      Moving slowly or fidgety/restless 0      Suicidal thoughts 0      PHQ-9 Score 0      Difficult doing work/chores Not difficult at all        Today's Vitals   01/02/24 1509 01/02/24 1554  BP: (!) 140/90 (!) 130/90  Pulse: 62   Temp: 98.6 F (37 C)   TempSrc: Oral   Weight: 198 lb 3.2 oz (89.9 kg)   Height: 5\' 4"  (1.626 m)   PainSc: 0-No pain    Body mass index is 34.02 kg/m.  Wt Readings from Last 3 Encounters:  01/02/24 198 lb  3.2 oz (89.9 kg)  05/25/23 194 lb (88 kg)  02/07/23 204 lb (92.5 kg)     Objective:  Physical Exam Vitals reviewed.  Constitutional:      General: She is not in acute distress.    Appearance: Normal appearance. She is obese.  Cardiovascular:     Rate and Rhythm: Normal rate and regular rhythm.     Pulses: Normal pulses.     Heart sounds: Normal heart sounds. No murmur heard. Pulmonary:     Effort: Pulmonary effort is normal. No respiratory distress.     Breath sounds: Normal breath sounds. No wheezing.  Skin:    General: Skin is warm and dry.     Capillary Refill: Capillary refill takes less than 2 seconds.  Neurological:     General: No focal deficit present.     Mental Status: She is alert and oriented to person, place, and time.     Cranial Nerves: No cranial nerve deficit.     Motor: No weakness.         Assessment And Plan:  Essential hypertension, benign Assessment & Plan: Blood pressure is slightly increased, repeat was improved. Discussed focusing on life style changes.   Orders: -      Microalbumin / creatinine urine ratio -     BMP8+eGFR  Abnormal glucose Assessment & Plan: Hgba1c was slightly better at 6, encouraged to continue focusing on healthy diet and regular exercise.   Orders: -     Hemoglobin A1c  Mixed hyperlipidemia Assessment & Plan: Cholesterol levels are stable.   Orders: -     Lipid panel  Influenza vaccination declined Assessment & Plan: Patient declined influenza vaccination at this time. Patient is aware that influenza vaccine prevents illness in 70% of healthy people, and reduces hospitalizations to 30-70% in elderly. This vaccine is recommended annually. Education has been provided regarding the importance of this vaccine but patient still declined. Advised may receive this vaccine at local pharmacy or Health Dept.or vaccine clinic. Aware to provide a copy of the vaccination record if obtained from local pharmacy or Health Dept.  Pt is willing to accept risk associated with refusing vaccination.    Class 1 obesity due to excess calories without serious comorbidity with body mass index (BMI) of 34.0 to 34.9 in adult Assessment & Plan: She is encouraged to strive for BMI less than 30 to decrease cardiac risk. Advised to aim for at least 150 minutes of exercise per week. She has gotten off track during the holidays.      Return for HM in 4 months.  Patient was given opportunity to ask questions. Patient verbalized understanding of the plan and was able to repeat key elements of the plan. All questions were answered to their satisfaction.    Jeanell Sparrow, FNP, have reviewed all documentation for this visit. The documentation on 01/02/24 for the exam, diagnosis, procedures, and orders are all accurate and complete.   IF YOU HAVE BEEN REFERRED TO A SPECIALIST, IT MAY TAKE 1-2 WEEKS TO SCHEDULE/PROCESS THE REFERRAL. IF YOU HAVE NOT HEARD FROM US/SPECIALIST IN TWO WEEKS, PLEASE GIVE [place] A CALL AT [phone].

## 2024-01-03 LAB — BMP8+EGFR
BUN/Creatinine Ratio: 15 (ref 9–23)
BUN: 15 mg/dL (ref 6–24)
CO2: 24 mmol/L (ref 20–29)
Calcium: 9.6 mg/dL (ref 8.7–10.2)
Chloride: 100 mmol/L (ref 96–106)
Creatinine, Ser: 0.97 mg/dL (ref 0.57–1.00)
Glucose: 67 mg/dL — ABNORMAL LOW (ref 70–99)
Potassium: 3.9 mmol/L (ref 3.5–5.2)
Sodium: 140 mmol/L (ref 134–144)
eGFR: 69 mL/min/{1.73_m2} (ref >59)

## 2024-01-03 LAB — HEMOGLOBIN A1C
Est. average glucose Bld gHb Est-mCnc: 120 mg/dL
Hgb A1c MFr Bld: 5.8 % — ABNORMAL HIGH (ref 4.8–5.6)

## 2024-01-03 LAB — MICROALBUMIN / CREATININE URINE RATIO
Creatinine, Urine: 73.6 mg/dL
Microalb/Creat Ratio: 9 mg/g{creat} (ref 0–29)
Microalbumin, Urine: 6.8 ug/mL

## 2024-01-03 LAB — LIPID PANEL
Chol/HDL Ratio: 2.2 {ratio} (ref 0.0–4.4)
Cholesterol, Total: 235 mg/dL — ABNORMAL HIGH (ref 100–199)
HDL: 106 mg/dL (ref >39)
LDL Chol Calc (NIH): 113 mg/dL — ABNORMAL HIGH (ref 0–99)
Triglycerides: 97 mg/dL (ref 0–149)
VLDL Cholesterol Cal: 16 mg/dL (ref 5–40)

## 2024-01-07 ENCOUNTER — Encounter: Payer: Self-pay | Admitting: Nurse Practitioner

## 2024-01-10 ENCOUNTER — Encounter: Payer: Self-pay | Admitting: Nurse Practitioner

## 2024-01-10 DIAGNOSIS — E6609 Other obesity due to excess calories: Secondary | ICD-10-CM | POA: Insufficient documentation

## 2024-01-10 DIAGNOSIS — Z2821 Immunization not carried out because of patient refusal: Secondary | ICD-10-CM | POA: Insufficient documentation

## 2024-01-10 DIAGNOSIS — E66811 Obesity, class 1: Secondary | ICD-10-CM | POA: Insufficient documentation

## 2024-01-10 NOTE — Assessment & Plan Note (Signed)
She is encouraged to strive for BMI less than 30 to decrease cardiac risk. Advised to aim for at least 150 minutes of exercise per week. She has gotten off track during the holidays.

## 2024-01-10 NOTE — Assessment & Plan Note (Signed)
Blood pressure is slightly increased, repeat was improved. Discussed focusing on life style changes.

## 2024-01-10 NOTE — Assessment & Plan Note (Signed)
Cholesterol levels are stable

## 2024-01-10 NOTE — Assessment & Plan Note (Signed)

## 2024-01-10 NOTE — Assessment & Plan Note (Signed)
Hgba1c was slightly better at 6, encouraged to continue focusing on healthy diet and regular exercise.

## 2024-01-22 ENCOUNTER — Other Ambulatory Visit: Payer: Self-pay

## 2024-01-22 ENCOUNTER — Encounter: Payer: Self-pay | Admitting: Nurse Practitioner

## 2024-01-22 DIAGNOSIS — I10 Essential (primary) hypertension: Secondary | ICD-10-CM

## 2024-01-22 MED ORDER — OLMESARTAN MEDOXOMIL-HCTZ 40-25 MG PO TABS: 1.0000 | ORAL_TABLET | Freq: Every day | ORAL | 1 refills | Status: AC

## 2024-07-22 ENCOUNTER — Ambulatory Visit: Payer: Self-pay
